# Patient Record
Sex: Male | Born: 1995 | Race: Black or African American | Hispanic: No | Marital: Single | Smoking: Current every day smoker
Health system: Southern US, Community
[De-identification: ages and names within clinical notes are randomized; demographics above are authoritative.]

## PROBLEM LIST (undated history)

## (undated) DIAGNOSIS — S62309A Unspecified fracture of unspecified metacarpal bone, initial encounter for closed fracture: Secondary | ICD-10-CM

## (undated) DIAGNOSIS — J45909 Unspecified asthma, uncomplicated: Secondary | ICD-10-CM

## (undated) HISTORY — PX: TONSILLECTOMY AND ADENOIDECTOMY: SHX28

## (undated) HISTORY — PX: TONSILLECTOMY: SUR1361

---
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
## 2016-10-04 ENCOUNTER — Emergency Department (HOSPITAL_COMMUNITY): Payer: Self-pay

## 2016-10-04 ENCOUNTER — Emergency Department (HOSPITAL_COMMUNITY)
Admission: EM | Admit: 2016-10-04 | Discharge: 2016-10-04 | Disposition: A | Discharge status: Home / Self Care | Payer: Self-pay | Attending: Emergency Medicine | Admitting: Emergency Medicine

## 2016-10-04 DIAGNOSIS — S62231A Other displaced fracture of base of first metacarpal bone, right hand, initial encounter for closed fracture: Secondary | ICD-10-CM | POA: Insufficient documentation

## 2016-10-04 DIAGNOSIS — Y929 Unspecified place or not applicable: Secondary | ICD-10-CM | POA: Insufficient documentation

## 2016-10-04 DIAGNOSIS — Y999 Unspecified external cause status: Secondary | ICD-10-CM | POA: Insufficient documentation

## 2016-10-04 DIAGNOSIS — W010XXA Fall on same level from slipping, tripping and stumbling without subsequent striking against object, initial encounter: Secondary | ICD-10-CM | POA: Insufficient documentation

## 2016-10-04 DIAGNOSIS — Y939 Activity, unspecified: Secondary | ICD-10-CM | POA: Insufficient documentation

## 2016-10-04 DIAGNOSIS — J45909 Unspecified asthma, uncomplicated: Secondary | ICD-10-CM | POA: Insufficient documentation

## 2016-10-04 MED ORDER — TRAMADOL HCL 50 MG PO TABS: 50.0000 mg | ORAL_TABLET | Freq: Four times a day (QID) | ORAL | 0 refills | Status: DC | PRN

## 2016-10-04 MED ORDER — HYDROCODONE-ACETAMINOPHEN 5-325 MG PO TABS
1.0000 | ORAL_TABLET | Freq: Once | ORAL | Status: AC
  Administered 2016-10-04: 1 via ORAL
  Filled 2016-10-04: qty 1

## 2016-10-04 NOTE — Discharge Instructions (Signed)
You were seen here today for hand pain. You have broken the first joint of your thumb. Please remain in your splint until you are seen by the hand specialist. Please call and make an appointment for this week. I have called and talked to him about your case. For pain control you may take:  800mg  of ibuprofen (that is usually 4 over the counter pills)  3 times a day (take with food) and acetaminophen 975mg  (this is 3 over the counter pills) four times a day. Do not drink alcohol or combine with other medications that have acetaminophen as an ingredient (Read the labels!).  For breakthrough pain you may take tramadol. Do not drink alcohol drive or operate heavy machinery when taking tramadol. Follow RICE therapy. If you develop worsening or new concerning symptoms you can return to the emergency department for re-evaluation.

## 2016-10-04 NOTE — Progress Notes (Signed)
Orthopedic Tech Progress Note Patient Details:  Don PonderDonel J Perry 1995/12/14 [id number]  Ortho Devices Type of Ortho Device: Ace wrap, Thumb spica splint Splint Material: Fiberglass Ortho Device/Splint Location: RUE Ortho Device/Splint Interventions: Ordered, Application   Jennye MoccasinHughes, [name] 10/04/2016, 9:18 PM

## 2016-10-04 NOTE — ED Provider Notes (Signed)
[facility]-EMERGENCY DEPT Provider Note   CSN: [id number] Arrival date & time: 10/04/16  1851     History   Chief Complaint Chief Complaint  Patient presents with  . Hand Injury    HPI Don Perry is a 21 y.o. right-handed male who presents emergency department today for right hand injury that occurred at approximately 5 PM yesterday. Patient was at work moving a couch onto a loading dock of a truck when he slipped and fell. The patient caught himself with an outstretched right hand. He now is having swelling and pain to the base of the right thumb. He is not able to move the right thumb without any pain. He has not taken anything for this. He denies numbness or tingling distal to the injury. No open wound.  HPI  Past Medical History:  Diagnosis Date  . Asthma     Patient Active Problem List   Diagnosis Date Noted  . Sports physical 07/15/2011    No past surgical history on file.     Home Medications    Prior to Admission medications   Medication Sig Start Date End Date Taking? Authorizing Provider  acetaminophen (TYLENOL) 500 MG tablet Take 500 mg by mouth every 6 (six) hours as needed. For pain    [provider]  albuterol (PROVENTIL HFA;VENTOLIN HFA) 108 (90 BASE) MCG/ACT inhaler Inhale 2 puffs into the lungs every 6 (six) hours as needed.    [provider]  traMADol (ULTRAM) 50 MG tablet Take 1 tablet (50 mg total) by mouth every 6 (six) hours as needed. 10/04/16   Jodene Polyak, Elmer SowMichael M, PA-C    Family History Family History  Problem Relation Age of Onset  . Sudden death Neg Hx   . Heart attack Neg Hx     Social History Social History  Substance Use Topics  . Smoking status: Never Smoker  . Smokeless tobacco: Not on file  . Alcohol use No     Allergies   Penicillins   Review of Systems Review of Systems  Constitutional: Negative for chills and fever.  Musculoskeletal: Positive for arthralgias and joint swelling.  Skin: Negative for  wound.  Neurological: Negative for numbness.     Physical Exam Updated Vital Signs BP 140/74 (BP Location: Right Arm)   Pulse 70   Temp 98.1 F (36.7 C) (Oral)   Resp 16   SpO2 98%   Physical Exam  Constitutional: He appears well-developed and well-nourished.  HENT:  Head: Normocephalic and atraumatic.  Right Ear: External ear normal.  Left Ear: External ear normal.  Eyes: Conjunctivae are normal. Right eye exhibits no discharge. Left eye exhibits no discharge. No scleral icterus.  Cardiovascular:  Pulses:      Radial pulses are 2+ on the right side, and 2+ on the left side.  Pulmonary/Chest: Effort normal. No respiratory distress.  Musculoskeletal:       Right wrist: Normal.  Right hand: Skin intact. Swelling to the base of the right 1st MCP. TTP at the base of the 1st MCP.  Limited ROM of the 1st MCP. 1st DIP isolated flexion and extension intact. Radial artery 2+. Finger with <2sec cap refill. SILT in M/U/R distributions.   Neurological: He is alert. No sensory deficit.  Skin: No pallor.  Psychiatric: He has a normal mood and affect.  Nursing note and vitals reviewed.    ED Treatments / Results  Labs (all labs ordered are listed, but only abnormal results are displayed) Labs Reviewed -  No data to display  EKG  EKG Interpretation None       Radiology Dg Hand 2 View Right  Result Date: 10/04/2016 CLINICAL DATA:  Acute right hand pain following fall yesterday. Initial encounter. EXAM: RIGHT HAND - 2 VIEW COMPARISON:  None. FINDINGS: An intraarticular fracture at the base of the first metacarpal is noted with 2 mm displacement. No other acute fracture identified. There is no evidence of dislocation. IMPRESSION: Intraarticular fracture at the base of the first metacarpal. Electronically Signed   By: Harmon Pier M.D.   On: 10/04/2016 19:54    Procedures Procedures (including critical care time)  Medications Ordered in ED Medications  HYDROcodone-acetaminophen  (NORCO/VICODIN) 5-325 MG per tablet 1 tablet (1 tablet Oral Given 10/04/16 2027)     Initial Impression / Assessment and Plan / ED Course  I have reviewed the triage vital signs and the nursing notes.  Pertinent labs & imaging results that were available during my care of the patient were reviewed by me and considered in my medical decision making (see chart for details).     21 year old male with injury to the dominant right hand. This occurred after FOOSH like injury yesterday. Patient has pain and swelling to the base of right thumb. He is NVI. On xray there is noted Intraarticular fracture at the base of the first metacarpal. Will place consult to hand. Advised Thumb spica and follow up this week. Patient placed in thumb spica and given instructions on this. Pain managed in ED. Patient reviewed in [place] Controlled Substance Reporting System. Will provide the patient with short course of pain medication as an outpatient. Advised RICE therapy. Specific return precautions discussed. The patient verbalized understanding and agreement with plan. All questions answered. No further questions at this time. The patient  appears safe for discharge.  Final Clinical Impressions(s) / ED Diagnoses   Final diagnoses:  Closed displaced fracture of base of first metacarpal bone of right hand, unspecified fracture morphology, initial encounter    New Prescriptions New Prescriptions   TRAMADOL (ULTRAM) 50 MG TABLET    Take 1 tablet (50 mg total) by mouth every 6 (six) hours as needed.     Jacinto Halim, PA-C 10/04/16 2105    Melene Plan, DO 10/04/16 2307

## 2016-10-04 NOTE — ED Notes (Signed)
Patient transported to X-ray 

## 2016-10-04 NOTE — ED Triage Notes (Signed)
Pt states yesterday he fell walking down truck ramp and caught himself with right hand, c/o right hand pain and swelling. Radial pulses +3 sensation intact.

## 2016-10-13 ENCOUNTER — Ambulatory Visit: Payer: Self-pay | Admitting: Orthopedic Surgery

## 2016-10-13 ENCOUNTER — Encounter (HOSPITAL_COMMUNITY): Payer: Self-pay | Admitting: *Deleted

## 2016-10-13 NOTE — Progress Notes (Signed)
Pt denies SOB, chest pain, and being under the care of a cardiologist. Pt denies having a stress test, echo and cardiac cath. Pt denies having an EKG and chest x ray. Pt denies having recent labs. Pt made aware to stop taking Aspirin,vitamins, fish oil and herbal medications. Do not take any NSAIDs ie: Ibuprofen, Advil, Naproxen (Aleve), Motrin, BC and Goody Powder or any medication containing Aspirin. Pt verbalized understanding of all pre-op instructions.

## 2016-10-14 ENCOUNTER — Encounter (HOSPITAL_COMMUNITY): Payer: Self-pay | Admitting: Certified Registered Nurse Anesthetist

## 2016-10-14 ENCOUNTER — Ambulatory Visit (HOSPITAL_COMMUNITY)
Admission: RE | Admit: 2016-10-14 | Discharge: 2016-10-14 | Disposition: A | Discharge status: Home / Self Care | Payer: Self-pay | Source: Ambulatory Visit | Attending: Orthopedic Surgery | Admitting: Orthopedic Surgery

## 2016-10-14 ENCOUNTER — Ambulatory Visit (HOSPITAL_COMMUNITY): Payer: Self-pay | Admitting: Certified Registered Nurse Anesthetist

## 2016-10-14 ENCOUNTER — Encounter (HOSPITAL_COMMUNITY)
Admission: RE | Disposition: A | Discharge status: Home / Self Care | Payer: Self-pay | Source: Ambulatory Visit | Attending: Orthopedic Surgery

## 2016-10-14 DIAGNOSIS — S62231A Other displaced fracture of base of first metacarpal bone, right hand, initial encounter for closed fracture: Secondary | ICD-10-CM | POA: Insufficient documentation

## 2016-10-14 DIAGNOSIS — Z880 Allergy status to penicillin: Secondary | ICD-10-CM | POA: Insufficient documentation

## 2016-10-14 DIAGNOSIS — X58XXXA Exposure to other specified factors, initial encounter: Secondary | ICD-10-CM | POA: Insufficient documentation

## 2016-10-14 DIAGNOSIS — J45909 Unspecified asthma, uncomplicated: Secondary | ICD-10-CM | POA: Insufficient documentation

## 2016-10-14 DIAGNOSIS — Y939 Activity, unspecified: Secondary | ICD-10-CM | POA: Insufficient documentation

## 2016-10-14 DIAGNOSIS — F17210 Nicotine dependence, cigarettes, uncomplicated: Secondary | ICD-10-CM | POA: Insufficient documentation

## 2016-10-14 DIAGNOSIS — Y929 Unspecified place or not applicable: Secondary | ICD-10-CM | POA: Insufficient documentation

## 2016-10-14 HISTORY — DX: Unspecified fracture of unspecified metacarpal bone, initial encounter for closed fracture: S62.309A

## 2016-10-14 HISTORY — PX: OPEN REDUCTION INTERNAL FIXATION (ORIF) METACARPAL: SHX6234

## 2016-10-14 LAB — CBC
HEMATOCRIT: 45 % (ref 39.0–52.0)
HEMOGLOBIN: 14.8 g/dL (ref 13.0–17.0)
MCH: 29.8 pg (ref 26.0–34.0)
MCHC: 32.9 g/dL (ref 30.0–36.0)
MCV: 90.5 fL (ref 78.0–100.0)
Platelets: 218 10*3/uL (ref 150–400)
RBC: 4.97 MIL/uL (ref 4.22–5.81)
RDW: 12.9 % (ref 11.5–15.5)
WBC: 3 10*3/uL — ABNORMAL LOW (ref 4.0–10.5)

## 2016-10-14 SURGERY — OPEN REDUCTION INTERNAL FIXATION (ORIF) METACARPAL
Anesthesia: General | Site: Hand | Laterality: Right

## 2016-10-14 MED ORDER — PROMETHAZINE HCL 25 MG/ML IJ SOLN: 6.2500 mg | INTRAMUSCULAR | Status: DC | PRN

## 2016-10-14 MED ORDER — BUPIVACAINE HCL (PF) 0.25 % IJ SOLN
INTRAMUSCULAR | Status: DC | PRN
  Administered 2016-10-14: 10 mL

## 2016-10-14 MED ORDER — MIDAZOLAM HCL 2 MG/2ML IJ SOLN
INTRAMUSCULAR | Status: AC
  Filled 2016-10-14: qty 2

## 2016-10-14 MED ORDER — CHLORHEXIDINE GLUCONATE 4 % EX LIQD: 60.0000 mL | Freq: Once | CUTANEOUS | Status: DC

## 2016-10-14 MED ORDER — PROPOFOL 10 MG/ML IV BOLUS
INTRAVENOUS | Status: AC
  Filled 2016-10-14: qty 40

## 2016-10-14 MED ORDER — POVIDONE-IODINE 10 % EX SWAB: 2.0000 "application " | Freq: Once | CUTANEOUS | Status: DC

## 2016-10-14 MED ORDER — LACTATED RINGERS IV SOLN: INTRAVENOUS | Status: DC

## 2016-10-14 MED ORDER — LACTATED RINGERS IV SOLN
INTRAVENOUS | Status: DC
  Administered 2016-10-14: 50 mL/h via INTRAVENOUS

## 2016-10-14 MED ORDER — VANCOMYCIN HCL IN DEXTROSE 1-5 GM/200ML-% IV SOLN
INTRAVENOUS | Status: AC
  Filled 2016-10-14: qty 200

## 2016-10-14 MED ORDER — OXYCODONE HCL 5 MG PO TABS
ORAL_TABLET | ORAL | Status: AC
  Administered 2016-10-14: 5 mg
  Filled 2016-10-14: qty 1

## 2016-10-14 MED ORDER — HYDROMORPHONE HCL 1 MG/ML IJ SOLN
INTRAMUSCULAR | Status: AC
  Administered 2016-10-14: 0.5 mg via INTRAVENOUS
  Filled 2016-10-14: qty 2

## 2016-10-14 MED ORDER — HYDROMORPHONE HCL 1 MG/ML IJ SOLN
0.2500 mg | INTRAMUSCULAR | Status: DC | PRN
  Administered 2016-10-14 (×4): 0.5 mg via INTRAVENOUS

## 2016-10-14 MED ORDER — OXYCODONE HCL 5 MG PO TABS: 5.0000 mg | ORAL_TABLET | Freq: Once | ORAL | Status: DC

## 2016-10-14 MED ORDER — PROPOFOL 10 MG/ML IV BOLUS
INTRAVENOUS | Status: DC | PRN
  Administered 2016-10-14: 200 mg via INTRAVENOUS

## 2016-10-14 MED ORDER — LACTATED RINGERS IV SOLN: INTRAVENOUS | Status: DC | PRN

## 2016-10-14 MED ORDER — LIDOCAINE 2% (20 MG/ML) 5 ML SYRINGE
INTRAMUSCULAR | Status: DC | PRN
  Administered 2016-10-14: 50 mg via INTRAVENOUS

## 2016-10-14 MED ORDER — VANCOMYCIN HCL IN DEXTROSE 1-5 GM/200ML-% IV SOLN
1000.0000 mg | INTRAVENOUS | Status: AC
  Administered 2016-10-14: 1000 mg via INTRAVENOUS

## 2016-10-14 MED ORDER — FENTANYL CITRATE (PF) 100 MCG/2ML IJ SOLN
INTRAMUSCULAR | Status: DC | PRN
  Administered 2016-10-14: 25 ug via INTRAVENOUS
  Administered 2016-10-14 (×2): 50 ug via INTRAVENOUS

## 2016-10-14 MED ORDER — MEPERIDINE HCL 25 MG/ML IJ SOLN: 6.2500 mg | INTRAMUSCULAR | Status: DC | PRN

## 2016-10-14 MED ORDER — FENTANYL CITRATE (PF) 250 MCG/5ML IJ SOLN
INTRAMUSCULAR | Status: AC
  Filled 2016-10-14: qty 5

## 2016-10-14 MED ORDER — BUPIVACAINE HCL (PF) 0.5 % IJ SOLN
INTRAMUSCULAR | Status: AC
  Filled 2016-10-14: qty 20

## 2016-10-14 MED ORDER — MIDAZOLAM HCL 5 MG/5ML IJ SOLN
INTRAMUSCULAR | Status: DC | PRN
  Administered 2016-10-14: 2 mg via INTRAVENOUS

## 2016-10-14 SURGICAL SUPPLY — 53 items
BANDAGE ACE 3X5.8 VEL STRL LF (GAUZE/BANDAGES/DRESSINGS) ×3 IMPLANT
BANDAGE ACE 4X5 VEL STRL LF (GAUZE/BANDAGES/DRESSINGS) ×3 IMPLANT
BANDAGE ELASTIC 4 VELCRO ST LF (GAUZE/BANDAGES/DRESSINGS) ×3 IMPLANT
BIT DRILL 1.8 CANN MAX VPC (BIT) ×3 IMPLANT
BLADE CLIPPER SURG (BLADE) IMPLANT
BNDG ESMARK 4X9 LF (GAUZE/BANDAGES/DRESSINGS) ×3 IMPLANT
BNDG GAUZE ELAST 4 BULKY (GAUZE/BANDAGES/DRESSINGS) ×3 IMPLANT
CORDS BIPOLAR (ELECTRODE) ×3 IMPLANT
COVER SURGICAL LIGHT HANDLE (MISCELLANEOUS) ×3 IMPLANT
CUFF TOURNIQUET SINGLE 18IN (TOURNIQUET CUFF) ×3 IMPLANT
CUFF TOURNIQUET SINGLE 24IN (TOURNIQUET CUFF) IMPLANT
DRAIN TLS ROUND 10FR (DRAIN) IMPLANT
DRAPE OEC MINIVIEW 54X84 (DRAPES) ×3 IMPLANT
DRAPE SURG 17X23 STRL (DRAPES) ×3 IMPLANT
DRSG EMULSION OIL 3X3 NADH (GAUZE/BANDAGES/DRESSINGS) ×3 IMPLANT
GAUZE SPONGE 4X4 12PLY STRL (GAUZE/BANDAGES/DRESSINGS) ×3 IMPLANT
GAUZE SPONGE 4X4 12PLY STRL LF (GAUZE/BANDAGES/DRESSINGS) ×3 IMPLANT
GAUZE XEROFORM 1X8 LF (GAUZE/BANDAGES/DRESSINGS) ×3 IMPLANT
GLOVE BIOGEL M 8.0 STRL (GLOVE) ×3 IMPLANT
GLOVE SS BIOGEL STRL SZ 8 (GLOVE) ×1 IMPLANT
GLOVE SUPERSENSE BIOGEL SZ 8 (GLOVE) ×2
GOWN STRL REUS W/ TWL LRG LVL3 (GOWN DISPOSABLE) ×3 IMPLANT
GOWN STRL REUS W/ TWL XL LVL3 (GOWN DISPOSABLE) ×3 IMPLANT
GOWN STRL REUS W/TWL LRG LVL3 (GOWN DISPOSABLE) ×6
GOWN STRL REUS W/TWL XL LVL3 (GOWN DISPOSABLE) ×6
K-WIRE COCR 0.9X95 (WIRE) ×6
KIT BASIN OR (CUSTOM PROCEDURE TRAY) ×3 IMPLANT
KIT ROOM TURNOVER OR (KITS) ×3 IMPLANT
KWIRE COCR 0.9X95 (WIRE) ×2 IMPLANT
MANIFOLD NEPTUNE II (INSTRUMENTS) IMPLANT
NEEDLE 22X1 1/2 (OR ONLY) (NEEDLE) ×3 IMPLANT
NS IRRIG 1000ML POUR BTL (IV SOLUTION) ×3 IMPLANT
PACK ORTHO EXTREMITY (CUSTOM PROCEDURE TRAY) ×3 IMPLANT
PAD ARMBOARD 7.5X6 YLW CONV (MISCELLANEOUS) ×6 IMPLANT
PAD CAST 3X4 CTTN HI CHSV (CAST SUPPLIES) ×1 IMPLANT
PAD CAST 4YDX4 CTTN HI CHSV (CAST SUPPLIES) ×1 IMPLANT
PADDING CAST COTTON 3X4 STRL (CAST SUPPLIES) ×2
PADDING CAST COTTON 4X4 STRL (CAST SUPPLIES) ×2
SCREW VPC 2.5X14MM (Screw) ×3 IMPLANT
SCRUB BETADINE 4OZ XXX (MISCELLANEOUS) IMPLANT
SOL PREP POV-IOD 4OZ 10% (MISCELLANEOUS) IMPLANT
SPONGE LAP 4X18 X RAY DECT (DISPOSABLE) ×3 IMPLANT
SUT MNCRL AB 4-0 PS2 18 (SUTURE) ×3 IMPLANT
SUT PROLENE 3 0 PS 2 (SUTURE) IMPLANT
SUT VIC AB 3-0 FS2 27 (SUTURE) IMPLANT
SYR CONTROL 10ML LL (SYRINGE) ×3 IMPLANT
SYSTEM CHEST DRAIN TLS 7FR (DRAIN) IMPLANT
TOWEL OR 17X24 6PK STRL BLUE (TOWEL DISPOSABLE) ×3 IMPLANT
TOWEL OR 17X26 10 PK STRL BLUE (TOWEL DISPOSABLE) ×3 IMPLANT
TUBE CONNECTING 12'X1/4 (SUCTIONS) ×1
TUBE CONNECTING 12X1/4 (SUCTIONS) ×2 IMPLANT
TUBE EVACUATION TLS (MISCELLANEOUS) ×3 IMPLANT
WATER STERILE IRR 1000ML POUR (IV SOLUTION) ×3 IMPLANT

## 2016-10-14 NOTE — Anesthesia Preprocedure Evaluation (Addendum)
Anesthesia Evaluation  Patient identified by MRN, date of birth, ID band Patient awake    Reviewed: Allergy & Precautions, NPO status , Patient's Chart, lab work & pertinent test results  Airway Mallampati: I  TM Distance: >3 FB Neck ROM: Full    Dental  (+) Teeth Intact, Dental Advisory Given   Pulmonary asthma , Current Smoker,    breath sounds clear to auscultation       Cardiovascular negative cardio ROS   Rhythm:Regular Rate:Normal     Neuro/Psych negative neurological ROS     GI/Hepatic negative GI ROS, Neg liver ROS,   Endo/Other  negative endocrine ROS  Renal/GU negative Renal ROS     Musculoskeletal negative musculoskeletal ROS (+)   Abdominal   Peds  Hematology negative hematology ROS (+)   Anesthesia Other Findings Day of surgery medications reviewed with the patient.  Reproductive/Obstetrics                            Anesthesia Physical Anesthesia Plan  ASA: II  Anesthesia Plan: General   Post-op Pain Management:    Induction: Intravenous  PONV Risk Score and Plan: 2 and Ondansetron and Dexamethasone  Airway Management Planned: LMA  Additional Equipment:   Intra-op Plan:   Post-operative Plan: Extubation in OR  Informed Consent: I have reviewed the patients History and Physical, chart, labs and discussed the procedure including the risks, benefits and alternatives for the proposed anesthesia with the patient or authorized representative who has indicated his/her understanding and acceptance.   Dental advisory given  Plan Discussed with: CRNA  Anesthesia Plan Comments: (Pt does not want entire arm to be numb. )       Anesthesia Quick Evaluation

## 2016-10-14 NOTE — H&P (Signed)
Don Perry is an 21 y.o. male.   Chief Complaint: right 1st MC Fx displaced HPI: Patient presents for evaluation and treatment of the of their upper extremity predicament. The patient denies neck, back, chest or  abdominal pain. The patient notes that they have no lower extremity problems. The patients primary complaint is noted. We are planning surgical care pathway for the upper extremity.  Past Medical History:  Diagnosis Date  . Asthma   . Metacarpal bone fracture    right first    Past Surgical History:  Procedure Laterality Date  . TONSILLECTOMY AND ADENOIDECTOMY      Family History  Problem Relation Age of Onset  . Sudden death Neg Hx   . Heart attack Neg Hx    Social History:  reports that he has been smoking Cigarettes.  He has been smoking about 0.50 packs per day. He has never used smokeless tobacco. He reports that he uses drugs, including Marijuana. He reports that he does not drink alcohol.  Allergies:  Allergies  Allergen Reactions  . Penicillins Hives    Has patient had a PCN reaction causing immediate rash, facial/tongue/throat swelling, SOB or lightheadedness with hypotension: Yes Has patient had a PCN reaction causing severe rash involving mucus membranes or skin necrosis: No Has patient had a PCN reaction that required hospitalization: No Has patient had a PCN reaction occurring within the last 10 years: No If all of the above answers are "NO", then may proceed with Cephalosporin use.     Medications Prior to Admission  Medication Sig Dispense Refill  . traMADol (ULTRAM) 50 MG tablet Take 1 tablet (50 mg total) by mouth every 6 (six) hours as needed. (Patient not taking: Reported on 10/13/2016) 12 tablet 0    Results for orders placed or performed during the hospital encounter of 10/14/16 (from the past 48 hour(s))  CBC     Status: Abnormal   Collection Time: 10/14/16 12:17 PM  Result Value Ref Range   WBC 3.0 (L) 4.0 - 10.5 K/uL   RBC 4.97 4.22 -  5.81 MIL/uL   Hemoglobin 14.8 13.0 - 17.0 g/dL   HCT 16.1 09.6 - 04.5 %   MCV 90.5 78.0 - 100.0 fL   MCH 29.8 26.0 - 34.0 pg   MCHC 32.9 30.0 - 36.0 g/dL   RDW 40.9 81.1 - 91.4 %   Platelets 218 150 - 400 K/uL   No results found.  Review of Systems  Respiratory: Negative.   Cardiovascular: Negative.   Gastrointestinal: Negative.   Genitourinary: Negative.     Blood pressure 124/73, pulse 61, temperature 98.3 F (36.8 C), resp. rate 16, height  (1.803 Perry), weight 79.2 kg (174 lb 9.7 oz), SpO2 99 %. Physical Exam Fx right 1st [facility] Fx with pain and deformity The patient is alert and oriented in no acute distress. The patient complains of pain in the affected upper extremity.  The patient is noted to have a normal HEENT exam. Lung fields show equal chest expansion and no shortness of breath. Abdomen exam is nontender without distention. Lower extremity examination does not show any fracture dislocation or blood clot symptoms. Pelvis is stable and the neck and back are stable and nontender.  Assessment/Plan Plan ORIF 1st MC fracture We are planning surgery for your upper extremity. The risk and benefits of surgery to include risk of bleeding, infection, anesthesia,  damage to normal structures and failure of the surgery to accomplish its intended goals of relieving  symptoms and restoring function have been discussed in detail. With this in mind we plan to proceed. I have specifically discussed with the patient the pre-and postoperative regime and the dos and don'ts and risk and benefits in great detail. Risk and benefits of surgery also include risk of dystrophy(CRPS), chronic nerve pain, failure of the healing process to go onto completion and other inherent risks of surgery The relavent the pathophysiology of the disease/injury process, as well as the alternatives for treatment and postoperative course of action has been discussed in great detail with the patient who desires to  proceed.  We will do everything in our power to help you (the patient) restore function to the upper extremity. It is a pleasure to see this patient today.   Karen ChafeGRAMIG III,Don Norwood M, MD 10/14/2016, 4:34 PM

## 2016-10-14 NOTE — Op Note (Signed)
See [id number]#[id number]  SP ORIF right thumb fracture  Bayler Gehrig MD

## 2016-10-14 NOTE — Transfer of Care (Signed)
Immediate Anesthesia Transfer of Care Note  Patient: Don Perry  Procedure(s) Performed: Procedure(s): OPEN REDUCTION INTERNAL FIXATION (ORIF)  RIGHT FIRST METACARPAL (Right)  Patient Location: PACU  Anesthesia Type:General  Level of Consciousness: awake and alert   Airway & Oxygen Therapy: Patient Spontanous Breathing  Post-op Assessment: Report given to RN and Post -op Vital signs reviewed and stable  Post vital signs: Reviewed and stable  Last Vitals:  Vitals:   10/14/16 1223  BP: 124/73  Pulse: 61  Resp: 16  Temp: 36.8 C  SpO2: 99%    Last Pain:  Vitals:   10/14/16 1234  PainSc: 8       Patients Stated Pain Goal: 8 (10/14/16 1234)  Complications: No apparent anesthesia complications

## 2016-10-14 NOTE — Discharge Instructions (Signed)

## 2016-10-14 NOTE — Anesthesia Procedure Notes (Signed)
Procedure Name: LMA Insertion Date/Time: 10/07/2016 4:56 PM Performed by: Gwenyth AllegraADAMI, Eligha Kmetz Pre-anesthesia Checklist: Patient identified, Emergency Drugs available, Suction available, Patient being monitored and Timeout performed Patient Re-evaluated:Patient Re-evaluated prior to induction Oxygen Delivery Method: Circle system utilized Preoxygenation: Pre-oxygenation with 100% oxygen Induction Type: IV induction LMA: LMA inserted LMA Size: 4.0 Number of attempts: 1 Placement Confirmation: positive ETCO2 and breath sounds checked- equal and bilateral Tube secured with: Tape Dental Injury: Teeth and Oropharynx as per pre-operative assessment

## 2016-10-15 ENCOUNTER — Encounter (HOSPITAL_COMMUNITY): Payer: Self-pay | Admitting: Orthopedic Surgery

## 2016-10-15 NOTE — Anesthesia Postprocedure Evaluation (Signed)
Anesthesia Post Note  Patient: Don Perry  Procedure(s) Performed: Procedure(s) (LRB): OPEN REDUCTION INTERNAL FIXATION (ORIF)  RIGHT FIRST METACARPAL (Right)     Patient location during evaluation: PACU Anesthesia Type: General Level of consciousness: awake and alert Pain management: pain level controlled Vital Signs Assessment: post-procedure vital signs reviewed and stable Respiratory status: spontaneous breathing, nonlabored ventilation, respiratory function stable and patient connected to nasal cannula oxygen Cardiovascular status: blood pressure returned to baseline and stable Postop Assessment: no signs of nausea or vomiting Anesthetic complications: no    Last Vitals:  Vitals:   10/14/16 1859 10/14/16 1915  BP: (!) 137/100 (!) 136/100  Pulse: 68 65  Resp: 18 17  Temp:    SpO2: 97% 97%    Last Pain:  Vitals:   10/14/16 1915  PainSc: 3                  Nirvi Boehler,W. EDMOND

## 2016-10-15 NOTE — Op Note (Signed)
NAME:  Don Perry, Don Perry                      ACCOUNT NO.:  MEDICAL RECORD NO.:  [id number]  LOCATION:                                 FACILITY:  PHYSICIAN:  Dionne AnoWilliam M. Yaakov Saindon, M.D.DATE OF BIRTH:  [birth year]  DATE OF PROCEDURE: DATE OF DISCHARGE:                              OPERATIVE REPORT   PREOPERATIVE DIAGNOSIS:  Comminuted intra-articular first metacarpal base fracture, right upper extremity.  POSTOPERATIVE DIAGNOSIS:  Comminuted intra-articular first metacarpal base fracture, right upper extremity.  PROCEDURES: 1. Open reduction and internal fixation, first CMC/carpometacarpal     fracture with Biomet headless screw. 2. 4-view x-ray series. 3. Superficial radial nerve neurolysis, extensive in nature.  SURGEON:  Dionne AnoWilliam M. Amanda PeaGramig, M.D.  ASSISTANT:  None.  COMPLICATIONS:  None.  ANESTHESIA:  General.  TOURNIQUET TIME:  Less than an hour.  INDICATIONS:  The patient is a 21 year old male who presents with an intra-articular fracture about the thumb.  The patient and I have discussed all the issues.  He desires to proceed with surgical intervention.  DESCRIPTION OF PROCEDURE:  He was taken to the operative theater and underwent a general anesthetic.  He was prepped with 2 separate Hibiclens scrubs followed by Betadine scrub and paint followed by sterile field being secured and time-out called.  The patient had manipulative reduction performed, however, the fracture did not move whatsoever and was impacted.  This was a Bennett-type fracture intra- articular at the [facility] joint at the base of the thumb, right upper extremity.  At this time, I made a Wagner incision and very carefully dissected down.  Superficial radial nerve underwent a neurolysis, and branching patterns were protected under 4.0 loupe magnification with facial nerve dissector.  Following this, the thenar musculature was elevated and the fracture was then accessed volarly.  I very carefully teased away the  fracture from its impacted position and aligned this about the joint.  I then placed a towel clamp-type reduction device and verified correct reduction.  The reduction looked excellent.  Once this was complete, we then placed a headless Biomet variable compression screw from dorsal to palmar very carefully.  This was done without difficulty.  This was a 14-mm screw, and there were no complicating features.  This achieved compression of cancellous surfaces with the variable angle screw and I was quite pleased.  I placed him through a range of motion, all looked well, there was no tendency towards drift or displacement.  Following this, I then repaired the thenar musculature and closed the fascial interval with FiberWire and then closed the skin edge with Prolene followed by final copy of x-rays had been taken.  The superficial radial nerve was protected at all times, and there were no complicating features.  All sponge, needle, and instrument counts were correct.  He was placed in a thumb spica splint, and 10 mL of Sensorcaine was used for postop analgesia.     Dionne AnoWilliam M. Amanda PeaGramig, M.D.     [facility]  D:  10/14/2016  T:  10/14/2016  Job:  [id number]

## 2018-11-27 IMAGING — CR DG HAND 2V*R*
2 series · 2 of 2 positions shown · non-contrast
Comparison: None.

CLINICAL DATA: Acute right hand pain following fall yesterday.
Initial encounter.

EXAM:
RIGHT HAND - 2 VIEW

[hand pa]
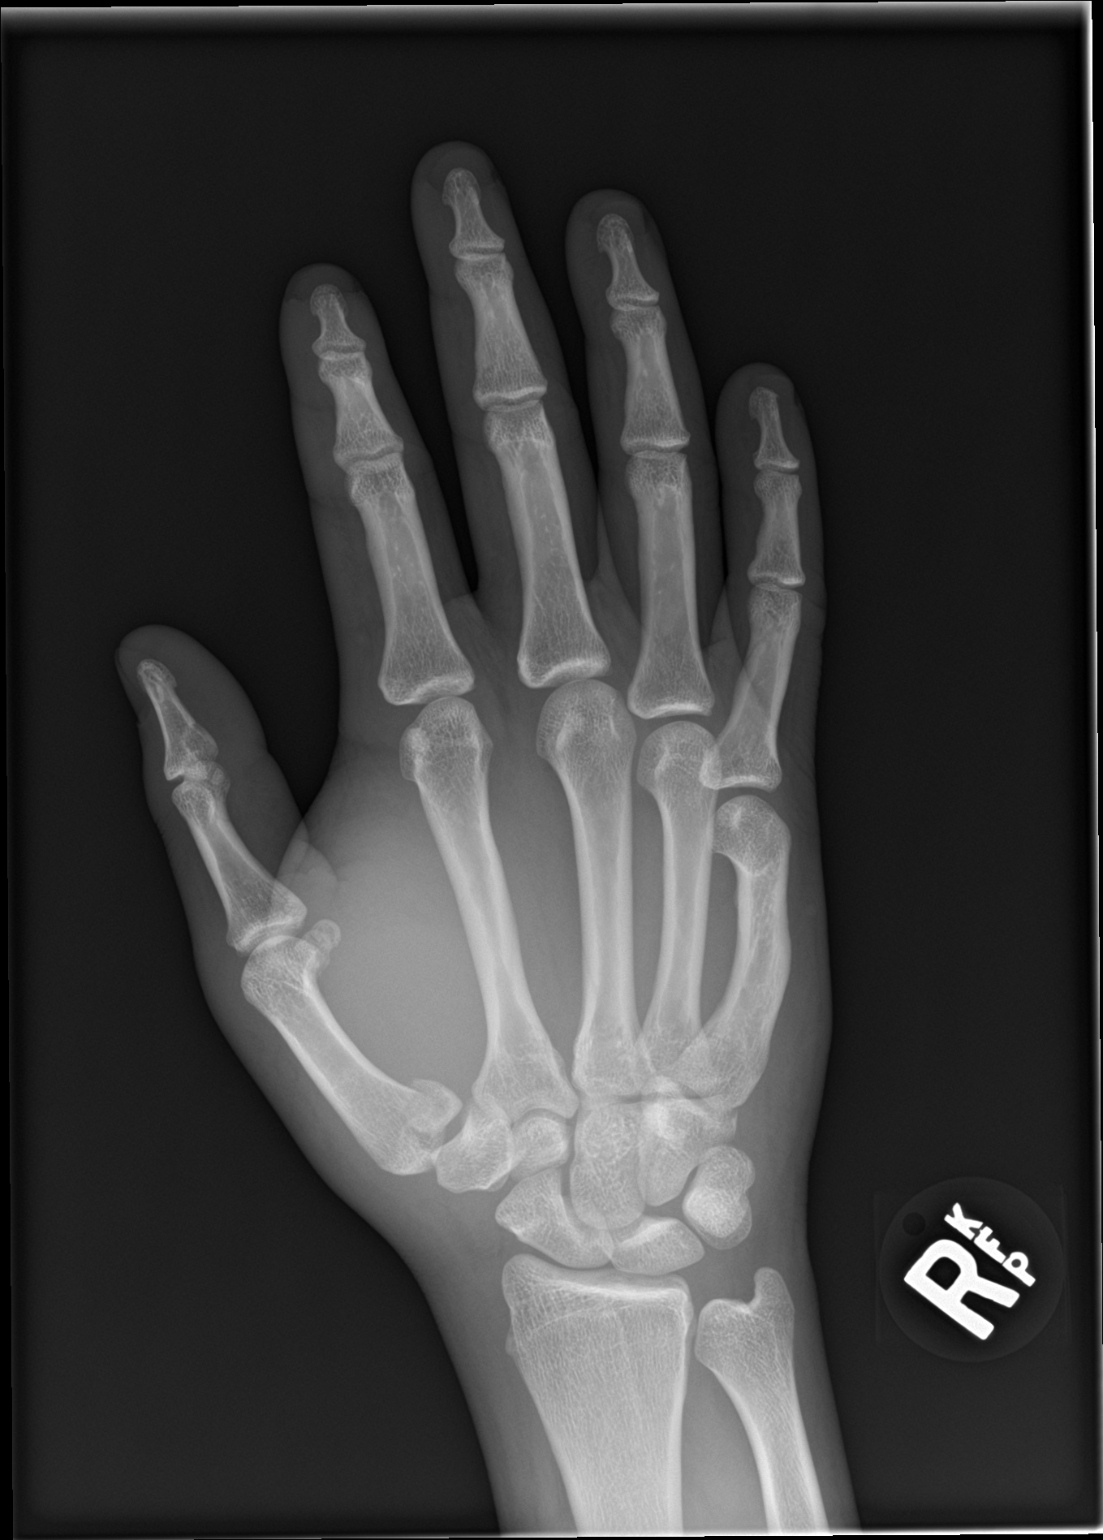

[hand lat]
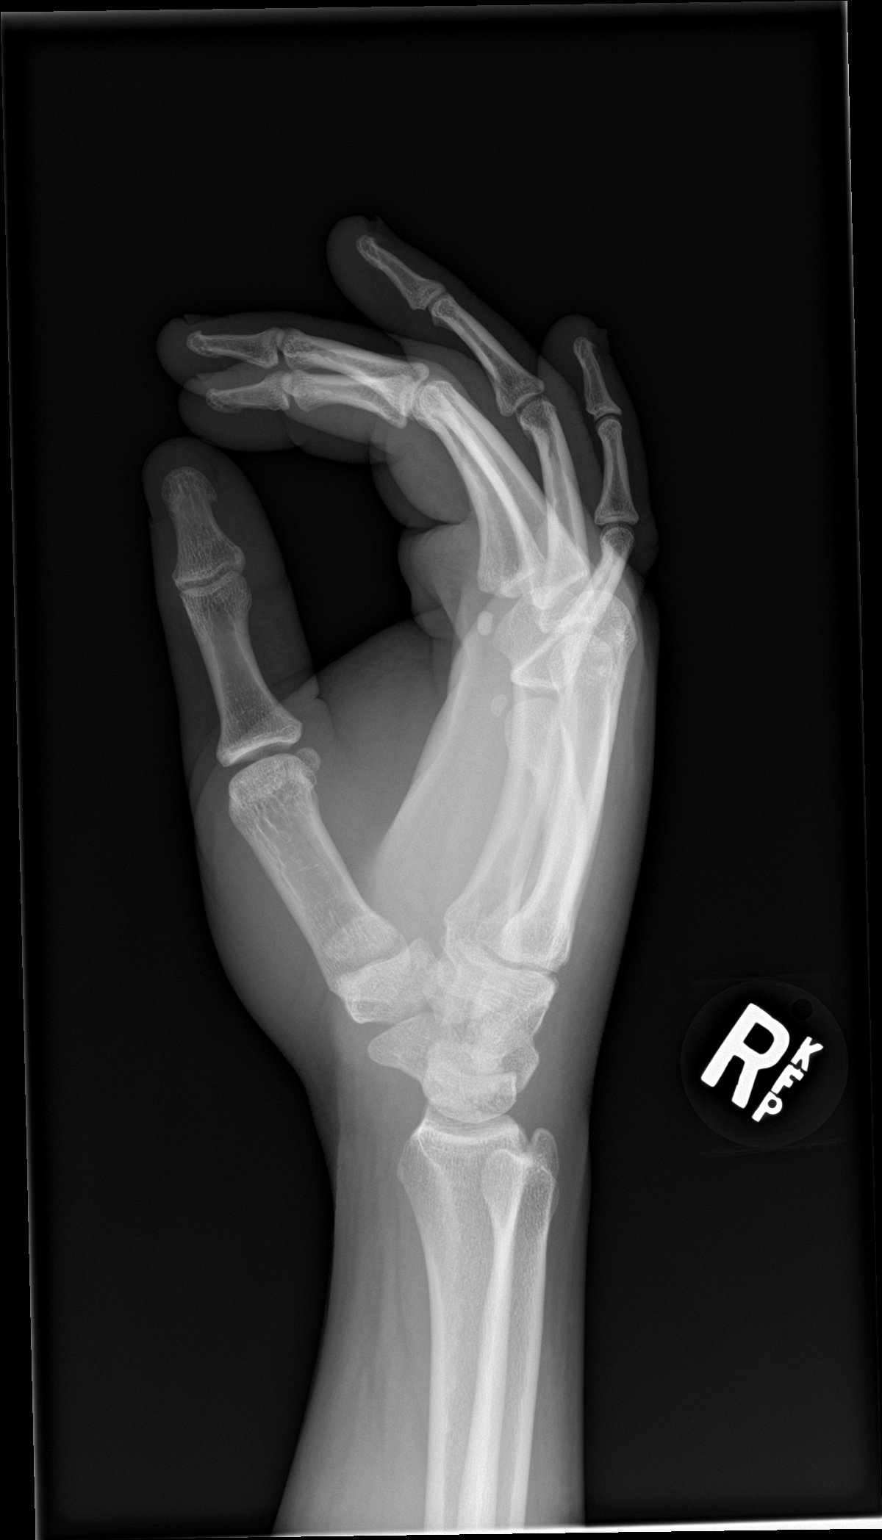

[2 of 2 positions shown; findings below may reference images not displayed]

FINDINGS: An intraarticular fracture at the base of the first metacarpal is
noted with 2 mm displacement.

No other acute fracture identified.

There is no evidence of dislocation.
IMPRESSION: Intraarticular fracture at the base of the first metacarpal.

## 2019-05-18 ENCOUNTER — Encounter (HOSPITAL_COMMUNITY): Payer: Self-pay | Admitting: *Deleted

## 2019-05-18 ENCOUNTER — Encounter (HOSPITAL_BASED_OUTPATIENT_CLINIC_OR_DEPARTMENT_OTHER): Payer: Self-pay | Admitting: *Deleted

## 2019-05-18 ENCOUNTER — Emergency Department (HOSPITAL_COMMUNITY)
Admission: EM | Admit: 2019-05-18 | Discharge: 2019-05-18 | Disposition: A | Discharge status: Home / Self Care | Payer: Self-pay | Attending: Emergency Medicine | Admitting: Emergency Medicine

## 2019-05-18 ENCOUNTER — Emergency Department (HOSPITAL_BASED_OUTPATIENT_CLINIC_OR_DEPARTMENT_OTHER)
Admission: EM | Admit: 2019-05-18 | Discharge: 2019-05-18 | Disposition: A | Discharge status: Home / Self Care | Payer: Medicaid Other | Attending: Emergency Medicine | Admitting: Emergency Medicine

## 2019-05-18 ENCOUNTER — Other Ambulatory Visit: Payer: Self-pay

## 2019-05-18 DIAGNOSIS — F17210 Nicotine dependence, cigarettes, uncomplicated: Secondary | ICD-10-CM | POA: Insufficient documentation

## 2019-05-18 DIAGNOSIS — F1010 Alcohol abuse, uncomplicated: Secondary | ICD-10-CM | POA: Insufficient documentation

## 2019-05-18 DIAGNOSIS — K920 Hematemesis: Secondary | ICD-10-CM | POA: Insufficient documentation

## 2019-05-18 DIAGNOSIS — Z5321 Procedure and treatment not carried out due to patient leaving prior to being seen by health care provider: Secondary | ICD-10-CM | POA: Insufficient documentation

## 2019-05-18 LAB — CBC
HCT: 50.3 % (ref 39.0–52.0)
Hemoglobin: 16.7 g/dL (ref 13.0–17.0)
MCH: 31.8 pg (ref 26.0–34.0)
MCHC: 33.2 g/dL (ref 30.0–36.0)
MCV: 95.8 fL (ref 80.0–100.0)
Platelets: 218 10*3/uL (ref 150–400)
RBC: 5.25 MIL/uL (ref 4.22–5.81)
RDW: 12.5 % (ref 11.5–15.5)
WBC: 6.4 10*3/uL (ref 4.0–10.5)
nRBC: 0 % (ref 0.0–0.2)

## 2019-05-18 LAB — COMPREHENSIVE METABOLIC PANEL
ALT: 26 U/L (ref 0–44)
AST: 32 U/L (ref 15–41)
Albumin: 4.8 g/dL (ref 3.5–5.0)
Alkaline Phosphatase: 46 U/L (ref 38–126)
Anion gap: 14 (ref 5–15)
BUN: 16 mg/dL (ref 6–20)
CO2: 21 mmol/L — ABNORMAL LOW (ref 22–32)
Calcium: 8.9 mg/dL (ref 8.9–10.3)
Chloride: 103 mmol/L (ref 98–111)
Creatinine, Ser: 1.04 mg/dL (ref 0.61–1.24)
GFR calc Af Amer: >60 mL/min (ref >60)
GFR calc non Af Amer: >60 mL/min (ref >60)
Glucose, Bld: 71 mg/dL (ref 70–99)
Potassium: 4.1 mmol/L (ref 3.5–5.1)
Sodium: 138 mmol/L (ref 135–145)
Total Bilirubin: 0.8 mg/dL (ref 0.3–1.2)
Total Protein: 7.9 g/dL (ref 6.5–8.1)

## 2019-05-18 LAB — URINALYSIS, ROUTINE W REFLEX MICROSCOPIC
Bilirubin Urine: NEGATIVE
Glucose, UA: NEGATIVE mg/dL
Hgb urine dipstick: NEGATIVE
Ketones, ur: 80 mg/dL — AB
Nitrite: NEGATIVE
Protein, ur: 30 mg/dL — AB
Specific Gravity, Urine: 1.028 (ref 1.005–1.030)
pH: 5 (ref 5.0–8.0)

## 2019-05-18 LAB — LIPASE, BLOOD: Lipase: 20 U/L (ref 11–51)

## 2019-05-18 MED ORDER — PANTOPRAZOLE SODIUM 40 MG IV SOLR
40.0000 mg | Freq: Once | INTRAVENOUS | Status: AC
  Administered 2019-05-18: 40 mg via INTRAVENOUS
  Filled 2019-05-18: qty 40

## 2019-05-18 MED ORDER — PANTOPRAZOLE SODIUM 20 MG PO TBEC: 20.0000 mg | DELAYED_RELEASE_TABLET | Freq: Every day | ORAL | 0 refills | Status: DC

## 2019-05-18 MED ORDER — ONDANSETRON HCL 4 MG/2ML IJ SOLN
4.0000 mg | Freq: Once | INTRAMUSCULAR | Status: AC
  Administered 2019-05-18: 4 mg via INTRAVENOUS
  Filled 2019-05-18: qty 2

## 2019-05-18 MED ORDER — FAMOTIDINE 20 MG PO TABS
40.0000 mg | ORAL_TABLET | Freq: Once | ORAL | Status: AC
  Administered 2019-05-18: 40 mg via ORAL
  Filled 2019-05-18: qty 2

## 2019-05-18 MED ORDER — SODIUM CHLORIDE 0.9% FLUSH: 3.0000 mL | Freq: Once | INTRAVENOUS | Status: DC

## 2019-05-18 MED ORDER — ONDANSETRON 4 MG PO TBDP: 4.0000 mg | ORAL_TABLET | Freq: Three times a day (TID) | ORAL | 0 refills | Status: DC | PRN

## 2019-05-18 MED ORDER — CHLORDIAZEPOXIDE HCL 25 MG PO CAPS: 50.0000 mg | ORAL_CAPSULE | Freq: Three times a day (TID) | ORAL | 0 refills | Status: DC | PRN

## 2019-05-18 MED FILL — CHLORDIAZEPOXIDE 25 MG CAPS: 25 | 4 days supply | Qty: 20 | Fill #0

## 2019-05-18 MED FILL — ONDANSETRON ODT 4MG TBDP: 4 | 2 days supply | Qty: 6 | Fill #0

## 2019-05-18 MED FILL — PANTOPRAZOLE SOD DR 20 MG T: 20 | 14 days supply | Qty: 14 | Fill #0

## 2019-05-18 NOTE — ED Notes (Signed)
Patient was at [facility] earlier this morning and was Triaged.  He left stating that he has waited for "3 to 4 hours and I can't do it."  He left [facility] ED with intact IV to his left AC.

## 2019-05-18 NOTE — ED Triage Notes (Signed)
Patient stated that he woke up this morning around 0400 vomiting copious amount of blood.  He drinks tequilla almost everyday.  Denies injury.

## 2019-05-18 NOTE — ED Provider Notes (Signed)
[facility] Provider Note   CSN: [id number] Arrival date & time: 05/18/19  7106     History Chief Complaint  Patient presents with  . Hematemesis    Don Perry is a 24 y.o. male.  24 year old male with past medical history below who presents with hematemesis.  Patient drinks approximately 2 pints of liquor daily, states he may be drinking a Shawon Denzer more heavily recently.  Last night he drank alcohol and then had an episode of vomiting prior to going to bed.  He did not think much of it but this morning he was awakened from sleep by vomiting and his first episode of vomiting was bright red blood. He has had multiple episodes of vomiting since then, which seem to be less volume than the initial episode.  More recently the emesis has been more brown rather than bright red.  He denies any abdominal pain, just nausea.  No diarrhea, melena, or hematochezia.  No urinary symptoms or fevers.  This is never happened before.  He denies any heavy NSAID use.  The history is provided by the patient.       Past Medical History:  Diagnosis Date  . Asthma   . Metacarpal bone fracture    right first    Patient Active Problem List   Diagnosis Date Noted  . Sports physical 07/15/2011    Past Surgical History:  Procedure Laterality Date  . OPEN REDUCTION INTERNAL FIXATION (ORIF) METACARPAL Right 10/14/2016   Procedure: OPEN REDUCTION INTERNAL FIXATION (ORIF)  RIGHT FIRST METACARPAL;  Surgeon: Roseanne Kaufman, MD;  Location: [place];  Service: Orthopedics;  Laterality: Right;  . TONSILLECTOMY AND ADENOIDECTOMY         Family History  Problem Relation Age of Onset  . Sudden death Neg Hx   . Heart attack Neg Hx     Social History   Tobacco Use  . Smoking status: Current Every Day Smoker    Packs/day: 0.50    Types: Cigarettes  . Smokeless tobacco: Never Used  Substance Use Topics  . Alcohol use: Yes  . Drug use: Yes    Types: Marijuana    Comment:  last use "  aweek ago" 10/13/16    Home Medications Prior to Admission medications   Medication Sig Start Date End Date Taking? Authorizing Provider  chlordiazePOXIDE (LIBRIUM) 25 MG capsule Take 2 capsules (50 mg total) by mouth 3 (three) times daily as needed for anxiety. 50mg  by mouth q 6 h day 1 50mg  by mouth q 8 h day 2 50mg  by mouth q 12 h day 3 50mg  by mough QHS day 4 05/18/19   Iyesha Such, Wenda Overland, MD  ondansetron (ZOFRAN ODT) 4 MG disintegrating tablet Take 1 tablet (4 mg total) by mouth every 8 (eight) hours as needed for nausea or vomiting. 05/18/19   Raheel Kunkle, Wenda Overland, MD  pantoprazole (PROTONIX) 20 MG tablet Take 1 tablet (20 mg total) by mouth daily. 05/18/19   Lasaro Primm, Wenda Overland, MD    Allergies    Penicillins  Review of Systems   Review of Systems All other systems reviewed and are negative except that which was mentioned in HPI  Physical Exam Updated Vital Signs BP (!) 150/83 (BP Location: Right Arm)   Pulse 84   Temp 97.6 F (36.4 C) (Oral)   Resp 16   Ht 5\' 11"  (1.803 m)   Wt 97.5 kg   SpO2 97%   BMI 29.99 kg/m   Physical  Exam Vitals and nursing note reviewed.  Constitutional:      General: He is not in acute distress.    Appearance: He is well-developed.  HENT:     Head: Normocephalic and atraumatic.     Mouth/Throat:     Mouth: Mucous membranes are moist.     Pharynx: Oropharynx is clear.  Eyes:     Conjunctiva/sclera: Conjunctivae normal.  Cardiovascular:     Rate and Rhythm: Normal rate and regular rhythm.     Heart sounds: Normal heart sounds. No murmur.  Pulmonary:     Effort: Pulmonary effort is normal.     Breath sounds: Normal breath sounds.  Abdominal:     General: Bowel sounds are normal. There is no distension.     Palpations: Abdomen is soft.     Tenderness: There is no abdominal tenderness.  Musculoskeletal:     Cervical back: Neck supple.  Skin:    General: Skin is warm and dry.  Neurological:     Mental Status: He is  alert and oriented to person, place, and time.     Comments: Fluent speech  Psychiatric:        Judgment: Judgment normal.     ED Results / Procedures / Treatments   Labs (all labs ordered are listed, but only abnormal results are displayed) Labs Reviewed - No data to display  EKG None  Radiology No results found.  Procedures Procedures (including critical care time)  Medications Ordered in ED Medications  pantoprazole (PROTONIX) injection 40 mg (40 mg Intravenous Given 05/18/19 1007)  famotidine (PEPCID) tablet 40 mg (40 mg Oral Given 05/18/19 1006)  ondansetron (ZOFRAN) injection 4 mg (4 mg Intravenous Given 05/18/19 1006)    ED Course  I have reviewed the triage vital signs and the nursing notes.  Pertinent labs that were available during my care of the patient were reviewed by me and considered in my medical decision making (see chart for details).    MDM Rules/Calculators/A&P                      Well-appearing on exam, comfortable with no abdominal tenderness.  Stable vital signs.  Patient had initially checked into [facility], ER and left prior to being seen. Pt given protonix, pepcid PO, and zofran. He was able to sip water. He later stated he had a family emergency and had to leave. I spent 10 minutes counseling patient on alcohol cessation and provided w/ librium for withdrawal symptoms.  I have counseled the patient on low acid diet and avoidance of NSAIDs.  Recommended follow-up with PCP.  Extensively reviewed return precautions with him including any worsening symptoms.  He voiced understanding. Final Clinical Impression(s) / ED Diagnoses Final diagnoses:  Hematemesis with nausea  Alcohol abuse    Rx / DC Orders ED Discharge Orders         Ordered    pantoprazole (PROTONIX) 20 MG tablet  Daily     05/18/19 1050    ondansetron (ZOFRAN ODT) 4 MG disintegrating tablet  Every 8 hours PRN     05/18/19 1050    chlordiazePOXIDE (LIBRIUM) 25 MG capsule  3 times  daily PRN     05/18/19 1050           Tayvin Preslar, Ambrose Finland, MD 05/18/19 1052

## 2019-05-18 NOTE — ED Triage Notes (Signed)
Pt arrives from home via [facility], medic reporting he has been vomiting blood x7 (bright red blood, progressed to dark). +ETOH use. 18g in the left arm, given 4 zofran. 136/85, 91hr, 97%ra.

## 2019-05-18 NOTE — ED Notes (Signed)
[facility] taken out of our system, [facility] is [facility] now.

## 2019-05-18 NOTE — ED Notes (Signed)
ED Provider at bedside. 

## 2019-05-18 NOTE — ED Triage Notes (Signed)
Pt says that he started vomiting blood when he woke up about 3 hours ago. He denies pain. Reports he drinks alcohol everyday.

## 2019-08-05 ENCOUNTER — Encounter (HOSPITAL_COMMUNITY): Payer: Self-pay | Admitting: Student

## 2019-08-05 ENCOUNTER — Observation Stay (HOSPITAL_COMMUNITY): Payer: Self-pay

## 2019-08-05 ENCOUNTER — Observation Stay (HOSPITAL_COMMUNITY): Payer: Self-pay | Admitting: Anesthesiology

## 2019-08-05 ENCOUNTER — Emergency Department (HOSPITAL_COMMUNITY): Payer: Self-pay

## 2019-08-05 ENCOUNTER — Other Ambulatory Visit: Payer: Self-pay

## 2019-08-05 ENCOUNTER — Inpatient Hospital Stay (HOSPITAL_COMMUNITY)
Admission: EM | Admit: 2019-08-05 | Discharge: 2019-08-08 | DRG: 465 | Disposition: A | Discharge status: Home / Self Care | Payer: Self-pay | Attending: Student | Admitting: Student

## 2019-08-05 ENCOUNTER — Encounter (HOSPITAL_COMMUNITY)
Admission: EM | Disposition: A | Discharge status: Home / Self Care | Payer: Self-pay | Source: Home / Self Care | Attending: Student

## 2019-08-05 DIAGNOSIS — F1010 Alcohol abuse, uncomplicated: Secondary | ICD-10-CM | POA: Diagnosis present

## 2019-08-05 DIAGNOSIS — Y904 Blood alcohol level of 80-99 mg/100 ml: Secondary | ICD-10-CM | POA: Diagnosis present

## 2019-08-05 DIAGNOSIS — R52 Pain, unspecified: Secondary | ICD-10-CM

## 2019-08-05 DIAGNOSIS — S81012A Laceration without foreign body, left knee, initial encounter: Secondary | ICD-10-CM

## 2019-08-05 DIAGNOSIS — S81011A Laceration without foreign body, right knee, initial encounter: Secondary | ICD-10-CM

## 2019-08-05 DIAGNOSIS — S82001B Unspecified fracture of right patella, initial encounter for open fracture type I or II: Secondary | ICD-10-CM | POA: Diagnosis present

## 2019-08-05 DIAGNOSIS — S82401C Unspecified fracture of shaft of right fibula, initial encounter for open fracture type IIIA, IIIB, or IIIC: Secondary | ICD-10-CM

## 2019-08-05 DIAGNOSIS — S82201B Unspecified fracture of shaft of right tibia, initial encounter for open fracture type I or II: Secondary | ICD-10-CM

## 2019-08-05 DIAGNOSIS — S82201C Unspecified fracture of shaft of right tibia, initial encounter for open fracture type IIIA, IIIB, or IIIC: Secondary | ICD-10-CM

## 2019-08-05 DIAGNOSIS — S51012A Laceration without foreign body of left elbow, initial encounter: Secondary | ICD-10-CM | POA: Diagnosis present

## 2019-08-05 DIAGNOSIS — T148XXA Other injury of unspecified body region, initial encounter: Secondary | ICD-10-CM

## 2019-08-05 DIAGNOSIS — S82251C Displaced comminuted fracture of shaft of right tibia, initial encounter for open fracture type IIIA, IIIB, or IIIC: Secondary | ICD-10-CM

## 2019-08-05 DIAGNOSIS — S82251B Displaced comminuted fracture of shaft of right tibia, initial encounter for open fracture type I or II: Principal | ICD-10-CM | POA: Diagnosis present

## 2019-08-05 DIAGNOSIS — E876 Hypokalemia: Secondary | ICD-10-CM | POA: Diagnosis present

## 2019-08-05 DIAGNOSIS — S82002B Unspecified fracture of left patella, initial encounter for open fracture type I or II: Secondary | ICD-10-CM | POA: Diagnosis present

## 2019-08-05 DIAGNOSIS — T07XXXA Unspecified multiple injuries, initial encounter: Secondary | ICD-10-CM

## 2019-08-05 DIAGNOSIS — S0102XA Laceration with foreign body of scalp, initial encounter: Secondary | ICD-10-CM | POA: Diagnosis present

## 2019-08-05 DIAGNOSIS — Z880 Allergy status to penicillin: Secondary | ICD-10-CM

## 2019-08-05 DIAGNOSIS — Z419 Encounter for procedure for purposes other than remedying health state, unspecified: Secondary | ICD-10-CM

## 2019-08-05 DIAGNOSIS — Z20822 Contact with and (suspected) exposure to covid-19: Secondary | ICD-10-CM | POA: Diagnosis present

## 2019-08-05 DIAGNOSIS — Y92410 Unspecified street and highway as the place of occurrence of the external cause: Secondary | ICD-10-CM

## 2019-08-05 DIAGNOSIS — S82491A Other fracture of shaft of right fibula, initial encounter for closed fracture: Secondary | ICD-10-CM

## 2019-08-05 HISTORY — PX: I & D EXTREMITY: SHX5045

## 2019-08-05 HISTORY — PX: TIBIA IM NAIL INSERTION: SHX2516

## 2019-08-05 HISTORY — PX: IRRIGATION AND DEBRIDEMENT KNEE: SHX5185

## 2019-08-05 LAB — COMPREHENSIVE METABOLIC PANEL
ALT: 36 U/L (ref 0–44)
AST: 38 U/L (ref 15–41)
Albumin: 4.1 g/dL (ref 3.5–5.0)
Alkaline Phosphatase: 46 U/L (ref 38–126)
Anion gap: 12 (ref 5–15)
BUN: 12 mg/dL (ref 6–20)
CO2: 22 mmol/L (ref 22–32)
Calcium: 9.1 mg/dL (ref 8.9–10.3)
Chloride: 105 mmol/L (ref 98–111)
Creatinine, Ser: 1.16 mg/dL (ref 0.61–1.24)
GFR calc Af Amer: >60 mL/min (ref >60)
GFR calc non Af Amer: >60 mL/min (ref >60)
Glucose, Bld: 148 mg/dL — ABNORMAL HIGH (ref 70–99)
Potassium: 3 mmol/L — ABNORMAL LOW (ref 3.5–5.1)
Sodium: 139 mmol/L (ref 135–145)
Total Bilirubin: 0.8 mg/dL (ref 0.3–1.2)
Total Protein: 6.5 g/dL (ref 6.5–8.1)

## 2019-08-05 LAB — SAMPLE TO BLOOD BANK

## 2019-08-05 LAB — I-STAT CHEM 8, ED
BUN: 12 mg/dL (ref 6–20)
Calcium, Ion: 1.18 mmol/L (ref 1.15–1.40)
Chloride: 103 mmol/L (ref 98–111)
Creatinine, Ser: 1.2 mg/dL (ref 0.61–1.24)
Glucose, Bld: 147 mg/dL — ABNORMAL HIGH (ref 70–99)
HCT: 47 % (ref 39.0–52.0)
Hemoglobin: 16 g/dL (ref 13.0–17.0)
Potassium: 3 mmol/L — ABNORMAL LOW (ref 3.5–5.1)
Sodium: 142 mmol/L (ref 135–145)
TCO2: 24 mmol/L (ref 22–32)

## 2019-08-05 LAB — PROTIME-INR
INR: 1 (ref 0.8–1.2)
Prothrombin Time: 12.7 seconds (ref 11.4–15.2)

## 2019-08-05 LAB — SARS CORONAVIRUS 2 BY RT PCR (HOSPITAL ORDER, PERFORMED IN ~~LOC~~ HOSPITAL LAB): SARS Coronavirus 2: NEGATIVE

## 2019-08-05 LAB — CBC
HCT: 46 % (ref 39.0–52.0)
Hemoglobin: 15.6 g/dL (ref 13.0–17.0)
MCH: 32.3 pg (ref 26.0–34.0)
MCHC: 33.9 g/dL (ref 30.0–36.0)
MCV: 95.2 fL (ref 80.0–100.0)
Platelets: 257 10*3/uL (ref 150–400)
RBC: 4.83 MIL/uL (ref 4.22–5.81)
RDW: 12 % (ref 11.5–15.5)
WBC: 11.9 10*3/uL — ABNORMAL HIGH (ref 4.0–10.5)
nRBC: 0 % (ref 0.0–0.2)

## 2019-08-05 LAB — ETHANOL: Alcohol, Ethyl (B): 97 mg/dL — ABNORMAL HIGH (ref <10)

## 2019-08-05 SURGERY — INSERTION, INTRAMEDULLARY ROD, TIBIA
Anesthesia: General | Site: Leg Lower | Laterality: Right

## 2019-08-05 MED ORDER — LABETALOL HCL 5 MG/ML IV SOLN
10.0000 mg | Freq: Once | INTRAVENOUS | Status: AC
  Administered 2019-08-05: 10 mg via INTRAVENOUS

## 2019-08-05 MED ORDER — LACTATED RINGERS IV SOLN: INTRAVENOUS | Status: DC

## 2019-08-05 MED ORDER — MIDAZOLAM HCL 2 MG/2ML IJ SOLN
INTRAMUSCULAR | Status: AC
  Filled 2019-08-05: qty 2

## 2019-08-05 MED ORDER — OXYCODONE HCL 5 MG PO TABS
10.0000 mg | ORAL_TABLET | ORAL | Status: DC | PRN
  Administered 2019-08-05: 10 mg via ORAL
  Administered 2019-08-05 – 2019-08-08 (×5): 15 mg via ORAL
  Filled 2019-08-05 (×5): qty 3

## 2019-08-05 MED ORDER — FENTANYL CITRATE (PF) 100 MCG/2ML IJ SOLN
INTRAMUSCULAR | Status: AC
  Filled 2019-08-05: qty 2

## 2019-08-05 MED ORDER — HYDROMORPHONE HCL 1 MG/ML IJ SOLN
0.5000 mg | INTRAMUSCULAR | Status: DC | PRN
  Administered 2019-08-05 – 2019-08-08 (×11): 1 mg via INTRAVENOUS
  Filled 2019-08-05 (×11): qty 1

## 2019-08-05 MED ORDER — METHOCARBAMOL 1000 MG/10ML IJ SOLN
500.0000 mg | Freq: Four times a day (QID) | INTRAVENOUS | Status: DC | PRN
  Filled 2019-08-05: qty 5

## 2019-08-05 MED ORDER — ROCURONIUM BROMIDE 10 MG/ML (PF) SYRINGE
PREFILLED_SYRINGE | INTRAVENOUS | Status: DC | PRN
  Administered 2019-08-05: 60 mg via INTRAVENOUS
  Administered 2019-08-05: 30 mg via INTRAVENOUS
  Administered 2019-08-05: 40 mg via INTRAVENOUS
  Administered 2019-08-05: 10 mg via INTRAVENOUS

## 2019-08-05 MED ORDER — SUCCINYLCHOLINE CHLORIDE 200 MG/10ML IV SOSY
PREFILLED_SYRINGE | INTRAVENOUS | Status: AC
  Filled 2019-08-05: qty 10

## 2019-08-05 MED ORDER — VANCOMYCIN HCL IN DEXTROSE 1-5 GM/200ML-% IV SOLN: 1000.0000 mg | INTRAVENOUS | Status: AC

## 2019-08-05 MED ORDER — ONDANSETRON HCL 4 MG PO TABS
4.0000 mg | ORAL_TABLET | Freq: Four times a day (QID) | ORAL | Status: DC | PRN
  Filled 2019-08-05: qty 1

## 2019-08-05 MED ORDER — ROCURONIUM BROMIDE 10 MG/ML (PF) SYRINGE
PREFILLED_SYRINGE | INTRAVENOUS | Status: AC
  Filled 2019-08-05: qty 20

## 2019-08-05 MED ORDER — SODIUM CHLORIDE 0.9 % IV SOLN
2.0000 g | Freq: Three times a day (TID) | INTRAVENOUS | Status: AC
  Administered 2019-08-05 – 2019-08-08 (×9): 2 g via INTRAVENOUS
  Filled 2019-08-05 (×9): qty 2

## 2019-08-05 MED ORDER — OXYCODONE HCL 5 MG PO TABS: 5.0000 mg | ORAL_TABLET | ORAL | Status: DC | PRN

## 2019-08-05 MED ORDER — CLINDAMYCIN PHOSPHATE 900 MG/50ML IV SOLN
900.0000 mg | INTRAVENOUS | Status: AC
  Administered 2019-08-05: 900 mg via INTRAVENOUS
  Filled 2019-08-05: qty 50

## 2019-08-05 MED ORDER — FENTANYL CITRATE (PF) 250 MCG/5ML IJ SOLN
INTRAMUSCULAR | Status: AC
  Filled 2019-08-05: qty 5

## 2019-08-05 MED ORDER — FENTANYL CITRATE (PF) 100 MCG/2ML IJ SOLN
100.0000 ug | Freq: Once | INTRAMUSCULAR | Status: AC
  Administered 2019-08-05: 100 ug via INTRAVENOUS
  Filled 2019-08-05: qty 2

## 2019-08-05 MED ORDER — VANCOMYCIN HCL 1000 MG IV SOLR
INTRAVENOUS | Status: DC | PRN
  Administered 2019-08-05: 1000 mg

## 2019-08-05 MED ORDER — ACETAMINOPHEN 500 MG PO TABS
1000.0000 mg | ORAL_TABLET | Freq: Once | ORAL | Status: AC
  Administered 2019-08-05: 1000 mg via ORAL
  Filled 2019-08-05: qty 2

## 2019-08-05 MED ORDER — ONDANSETRON HCL 4 MG/2ML IJ SOLN
4.0000 mg | Freq: Four times a day (QID) | INTRAMUSCULAR | Status: DC | PRN
  Administered 2019-08-05: 4 mg via INTRAVENOUS
  Filled 2019-08-05 (×2): qty 2

## 2019-08-05 MED ORDER — LIDOCAINE 2% (20 MG/ML) 5 ML SYRINGE
INTRAMUSCULAR | Status: DC | PRN
  Administered 2019-08-05: 80 mg via INTRAVENOUS

## 2019-08-05 MED ORDER — DEXAMETHASONE SODIUM PHOSPHATE 10 MG/ML IJ SOLN
INTRAMUSCULAR | Status: DC | PRN
  Administered 2019-08-05: 10 mg via INTRAVENOUS

## 2019-08-05 MED ORDER — DOCUSATE SODIUM 100 MG PO CAPS
100.0000 mg | ORAL_CAPSULE | Freq: Two times a day (BID) | ORAL | Status: DC
  Administered 2019-08-05 – 2019-08-08 (×4): 100 mg via ORAL
  Filled 2019-08-05 (×5): qty 1

## 2019-08-05 MED ORDER — TOBRAMYCIN SULFATE 1.2 G IJ SOLR
INTRAMUSCULAR | Status: AC
  Filled 2019-08-05: qty 1.2

## 2019-08-05 MED ORDER — HYDROMORPHONE HCL 1 MG/ML IJ SOLN
0.2500 mg | INTRAMUSCULAR | Status: DC | PRN
  Administered 2019-08-05: 0.5 mg via INTRAVENOUS

## 2019-08-05 MED ORDER — DEXAMETHASONE SODIUM PHOSPHATE 10 MG/ML IJ SOLN
INTRAMUSCULAR | Status: AC
  Filled 2019-08-05: qty 2

## 2019-08-05 MED ORDER — SUCCINYLCHOLINE CHLORIDE 200 MG/10ML IV SOSY
PREFILLED_SYRINGE | INTRAVENOUS | Status: DC | PRN
  Administered 2019-08-05: 120 mg via INTRAVENOUS

## 2019-08-05 MED ORDER — TOBRAMYCIN SULFATE 1.2 G IJ SOLR
INTRAMUSCULAR | Status: DC | PRN
  Administered 2019-08-05: 1.2 g

## 2019-08-05 MED ORDER — SUGAMMADEX SODIUM 200 MG/2ML IV SOLN
INTRAVENOUS | Status: DC | PRN
Start: 2019-08-05 — End: 2019-08-05
  Administered 2019-08-05: 200 mg via INTRAVENOUS

## 2019-08-05 MED ORDER — LIDOCAINE 2% (20 MG/ML) 5 ML SYRINGE
INTRAMUSCULAR | Status: AC
  Filled 2019-08-05: qty 10

## 2019-08-05 MED ORDER — TOBRAMYCIN SULFATE 1.2 G IJ SOLR
INTRAMUSCULAR | Status: AC
  Filled 2019-08-05: qty 2.4

## 2019-08-05 MED ORDER — 0.9 % SODIUM CHLORIDE (POUR BTL) OPTIME
TOPICAL | Status: DC | PRN
  Administered 2019-08-05: 1000 mL

## 2019-08-05 MED ORDER — ENOXAPARIN SODIUM 40 MG/0.4ML ~~LOC~~ SOLN
40.0000 mg | SUBCUTANEOUS | Status: DC
  Administered 2019-08-06: 40 mg via SUBCUTANEOUS
  Filled 2019-08-05 (×4): qty 0.4

## 2019-08-05 MED ORDER — GABAPENTIN 100 MG PO CAPS
100.0000 mg | ORAL_CAPSULE | Freq: Three times a day (TID) | ORAL | Status: DC
  Administered 2019-08-05 – 2019-08-08 (×9): 100 mg via ORAL
  Filled 2019-08-05 (×9): qty 1

## 2019-08-05 MED ORDER — HYDROMORPHONE HCL 1 MG/ML IJ SOLN
INTRAMUSCULAR | Status: DC | PRN
  Administered 2019-08-05 (×2): .5 mg via INTRAVENOUS

## 2019-08-05 MED ORDER — POLYETHYLENE GLYCOL 3350 17 G PO PACK: 17.0000 g | PACK | Freq: Every day | ORAL | Status: DC | PRN

## 2019-08-05 MED ORDER — TETANUS-DIPHTH-ACELL PERTUSSIS 5-2.5-18.5 LF-MCG/0.5 IM SUSP
0.5000 mL | Freq: Once | INTRAMUSCULAR | Status: AC
  Administered 2019-08-05: 0.5 mL via INTRAMUSCULAR
  Filled 2019-08-05: qty 0.5

## 2019-08-05 MED ORDER — FENTANYL CITRATE (PF) 100 MCG/2ML IJ SOLN
INTRAMUSCULAR | Status: AC
  Administered 2019-08-05: 100 ug via INTRAVENOUS
  Filled 2019-08-05: qty 2

## 2019-08-05 MED ORDER — MORPHINE SULFATE (PF) 2 MG/ML IV SOLN: 2.0000 mg | INTRAVENOUS | Status: DC | PRN

## 2019-08-05 MED ORDER — HYDROMORPHONE HCL 1 MG/ML IJ SOLN
INTRAMUSCULAR | Status: AC
  Filled 2019-08-05: qty 0.5

## 2019-08-05 MED ORDER — POTASSIUM CHLORIDE 10 MEQ/100ML IV SOLN
10.0000 meq | Freq: Once | INTRAVENOUS | Status: AC
  Administered 2019-08-05: 10 meq via INTRAVENOUS
  Filled 2019-08-05: qty 100

## 2019-08-05 MED ORDER — CHLORHEXIDINE GLUCONATE 0.12 % MT SOLN
OROMUCOSAL | Status: AC
  Administered 2019-08-05: 15 mL via OROMUCOSAL
  Filled 2019-08-05: qty 15

## 2019-08-05 MED ORDER — POTASSIUM CHLORIDE IN NACL 20-0.9 MEQ/L-% IV SOLN
INTRAVENOUS | Status: DC
  Filled 2019-08-05 (×2): qty 1000

## 2019-08-05 MED ORDER — ONDANSETRON HCL 4 MG/2ML IJ SOLN
INTRAMUSCULAR | Status: AC
  Filled 2019-08-05: qty 4

## 2019-08-05 MED ORDER — FENTANYL CITRATE (PF) 100 MCG/2ML IJ SOLN: 100.0000 ug | Freq: Once | INTRAMUSCULAR | Status: AC

## 2019-08-05 MED ORDER — METOCLOPRAMIDE HCL 5 MG/ML IJ SOLN: 5.0000 mg | Freq: Three times a day (TID) | INTRAMUSCULAR | Status: DC | PRN

## 2019-08-05 MED ORDER — ROCURONIUM BROMIDE 10 MG/ML (PF) SYRINGE
PREFILLED_SYRINGE | INTRAVENOUS | Status: AC
  Filled 2019-08-05: qty 10

## 2019-08-05 MED ORDER — CHLORHEXIDINE GLUCONATE 4 % EX LIQD: 60.0000 mL | Freq: Once | CUTANEOUS | Status: DC

## 2019-08-05 MED ORDER — FENTANYL CITRATE (PF) 100 MCG/2ML IJ SOLN
INTRAMUSCULAR | Status: DC | PRN
  Administered 2019-08-05 (×3): 50 ug via INTRAVENOUS
  Administered 2019-08-05: 100 ug via INTRAVENOUS
  Administered 2019-08-05 (×3): 50 ug via INTRAVENOUS
  Administered 2019-08-05: 100 ug via INTRAVENOUS

## 2019-08-05 MED ORDER — POVIDONE-IODINE 10 % EX SWAB: 2.0000 "application " | Freq: Once | CUTANEOUS | Status: DC

## 2019-08-05 MED ORDER — ACETAMINOPHEN 500 MG PO TABS
1000.0000 mg | ORAL_TABLET | Freq: Four times a day (QID) | ORAL | Status: DC
  Administered 2019-08-05 – 2019-08-08 (×10): 1000 mg via ORAL
  Filled 2019-08-05 (×10): qty 2

## 2019-08-05 MED ORDER — MIDAZOLAM HCL 5 MG/5ML IJ SOLN
INTRAMUSCULAR | Status: DC | PRN
  Administered 2019-08-05: 2 mg via INTRAVENOUS

## 2019-08-05 MED ORDER — LIDOCAINE-EPINEPHRINE (PF) 2 %-1:200000 IJ SOLN
20.0000 mL | Freq: Once | INTRAMUSCULAR | Status: AC
  Administered 2019-08-05: 20 mL
  Filled 2019-08-05: qty 20

## 2019-08-05 MED ORDER — PROPOFOL 10 MG/ML IV BOLUS
INTRAVENOUS | Status: AC
  Filled 2019-08-05: qty 20

## 2019-08-05 MED ORDER — LABETALOL HCL 5 MG/ML IV SOLN
INTRAVENOUS | Status: AC
  Filled 2019-08-05: qty 4

## 2019-08-05 MED ORDER — IOHEXOL 300 MG/ML  SOLN
100.0000 mL | Freq: Once | INTRAMUSCULAR | Status: AC | PRN
  Administered 2019-08-05: 100 mL via INTRAVENOUS

## 2019-08-05 MED ORDER — OXYCODONE HCL 5 MG PO TABS
5.0000 mg | ORAL_TABLET | ORAL | Status: DC | PRN
  Filled 2019-08-05: qty 2

## 2019-08-05 MED ORDER — VANCOMYCIN HCL IN DEXTROSE 1-5 GM/200ML-% IV SOLN
INTRAVENOUS | Status: AC
  Administered 2019-08-05: 1000 mg via INTRAVENOUS
  Filled 2019-08-05: qty 200

## 2019-08-05 MED ORDER — CHLORHEXIDINE GLUCONATE 0.12 % MT SOLN
15.0000 mL | OROMUCOSAL | Status: AC
  Filled 2019-08-05: qty 15

## 2019-08-05 MED ORDER — METHOCARBAMOL 500 MG PO TABS
500.0000 mg | ORAL_TABLET | Freq: Four times a day (QID) | ORAL | Status: DC | PRN
  Administered 2019-08-06 – 2019-08-08 (×5): 500 mg via ORAL
  Filled 2019-08-05 (×6): qty 1

## 2019-08-05 MED ORDER — HYDRALAZINE HCL 10 MG PO TABS: 10.0000 mg | ORAL_TABLET | Freq: Four times a day (QID) | ORAL | Status: DC | PRN

## 2019-08-05 MED ORDER — VANCOMYCIN HCL 1000 MG IV SOLR
INTRAVENOUS | Status: AC
  Filled 2019-08-05: qty 1000

## 2019-08-05 MED ORDER — PROPOFOL 10 MG/ML IV BOLUS
INTRAVENOUS | Status: DC | PRN
  Administered 2019-08-05: 150 mg via INTRAVENOUS

## 2019-08-05 MED ORDER — CLINDAMYCIN PHOSPHATE 900 MG/50ML IV SOLN
900.0000 mg | Freq: Three times a day (TID) | INTRAVENOUS | Status: AC
  Administered 2019-08-05 – 2019-08-08 (×9): 900 mg via INTRAVENOUS
  Filled 2019-08-05 (×9): qty 50

## 2019-08-05 MED ORDER — METOCLOPRAMIDE HCL 5 MG PO TABS: 5.0000 mg | ORAL_TABLET | Freq: Three times a day (TID) | ORAL | Status: DC | PRN

## 2019-08-05 MED ORDER — SODIUM CHLORIDE 0.9 % IR SOLN
Status: DC | PRN
  Administered 2019-08-05 (×3): 3000 mL

## 2019-08-05 MED ORDER — HYDROMORPHONE HCL 1 MG/ML IJ SOLN
INTRAMUSCULAR | Status: AC
  Filled 2019-08-05: qty 1

## 2019-08-05 MED ORDER — VANCOMYCIN HCL 1000 MG IV SOLR
INTRAVENOUS | Status: AC
  Filled 2019-08-05: qty 2000

## 2019-08-05 MED ORDER — PHENYLEPHRINE 40 MCG/ML (10ML) SYRINGE FOR IV PUSH (FOR BLOOD PRESSURE SUPPORT)
PREFILLED_SYRINGE | INTRAVENOUS | Status: AC
  Filled 2019-08-05: qty 10

## 2019-08-05 SURGICAL SUPPLY — 85 items
BIT DRILL CALIBRATED 4.2 (BIT) ×2 IMPLANT
BIT DRILL SHORT 4.2 (BIT) ×4 IMPLANT
BLADE SURG 10 STRL SS (BLADE) IMPLANT
BNDG COHESIVE 4X5 TAN STRL (GAUZE/BANDAGES/DRESSINGS) IMPLANT
BNDG ELASTIC 4X5.8 VLCR STR LF (GAUZE/BANDAGES/DRESSINGS) IMPLANT
BNDG ELASTIC 6X10 VLCR STRL LF (GAUZE/BANDAGES/DRESSINGS) ×8 IMPLANT
BNDG ELASTIC 6X5.8 VLCR STR LF (GAUZE/BANDAGES/DRESSINGS) IMPLANT
BNDG GAUZE ELAST 4 BULKY (GAUZE/BANDAGES/DRESSINGS) IMPLANT
BRUSH SCRUB EZ PLAIN DRY (MISCELLANEOUS) ×8 IMPLANT
CANISTER WOUNDNEG PRESSURE 500 (CANNISTER) ×4 IMPLANT
CHLORAPREP W/TINT 26 (MISCELLANEOUS) ×12 IMPLANT
COVER MAYO STAND STRL (DRAPES) ×4 IMPLANT
COVER SURGICAL LIGHT HANDLE (MISCELLANEOUS) ×4 IMPLANT
COVER WAND RF STERILE (DRAPES) IMPLANT
DRAPE C-ARM 42X72 X-RAY (DRAPES) ×4 IMPLANT
DRAPE C-ARMOR (DRAPES) ×4 IMPLANT
DRAPE EXTREMITY BILATERAL (DRAPES) ×4 IMPLANT
DRAPE HALF SHEET 40X57 (DRAPES) ×8 IMPLANT
DRAPE IMP U-DRAPE 54X76 (DRAPES) ×8 IMPLANT
DRAPE INCISE IOBAN 66X45 STRL (DRAPES) IMPLANT
DRAPE ORTHO SPLIT 77X108 STRL (DRAPES) ×8
DRAPE SURG 17X23 STRL (DRAPES) IMPLANT
DRAPE SURG ORHT 6 SPLT 77X108 (DRAPES) ×4 IMPLANT
DRAPE U-SHAPE 47X51 STRL (DRAPES) ×8 IMPLANT
DRILL BIT CALIBRATED 4.2 (BIT) ×4
DRILL BIT SHORT 4.2 (BIT) ×8
DRSG ADAPTIC 3X8 NADH LF (GAUZE/BANDAGES/DRESSINGS) IMPLANT
DRSG MEPITEL 4X7.2 (GAUZE/BANDAGES/DRESSINGS) ×4 IMPLANT
DRSG MEPITEL 8X12 (GAUZE/BANDAGES/DRESSINGS) ×3 IMPLANT
ELECT REM PT RETURN 9FT ADLT (ELECTROSURGICAL) ×4
ELECTRODE REM PT RTRN 9FT ADLT (ELECTROSURGICAL) ×2 IMPLANT
EVACUATOR 1/8 PVC DRAIN (DRAIN) IMPLANT
GAUZE SPONGE 4X4 12PLY STRL (GAUZE/BANDAGES/DRESSINGS) IMPLANT
GAUZE SPONGE 4X4 12PLY STRL LF (GAUZE/BANDAGES/DRESSINGS) ×8 IMPLANT
GLOVE BIO SURGEON STRL SZ 6.5 (GLOVE) ×9 IMPLANT
GLOVE BIO SURGEON STRL SZ7.5 (GLOVE) ×24 IMPLANT
GLOVE BIO SURGEONS STRL SZ 6.5 (GLOVE) ×3
GLOVE BIOGEL PI IND STRL 6.5 (GLOVE) ×2 IMPLANT
GLOVE BIOGEL PI IND STRL 7.5 (GLOVE) ×4 IMPLANT
GLOVE BIOGEL PI INDICATOR 6.5 (GLOVE) ×2
GLOVE BIOGEL PI INDICATOR 7.5 (GLOVE) ×4
GOWN STRL REUS W/ TWL LRG LVL3 (GOWN DISPOSABLE) ×10 IMPLANT
GOWN STRL REUS W/TWL LRG LVL3 (GOWN DISPOSABLE) ×20
GUIDEWIRE 3.2X400 (WIRE) ×4 IMPLANT
HANDPIECE INTERPULSE COAX TIP (DISPOSABLE) ×4
KIT BASIN OR (CUSTOM PROCEDURE TRAY) ×4 IMPLANT
KIT PREVENA INCISION MGT 13 (CANNISTER) ×4 IMPLANT
KIT TURNOVER KIT B (KITS) ×4 IMPLANT
MANIFOLD NEPTUNE II (INSTRUMENTS) ×4 IMPLANT
MEPITEL 8X12 (GAUZE/BANDAGES/DRESSINGS) ×1
NAIL TIB 10X390 (Nail) ×4 IMPLANT
NS IRRIG 1000ML POUR BTL (IV SOLUTION) ×4 IMPLANT
PACK ORTHO EXTREMITY (CUSTOM PROCEDURE TRAY) ×4 IMPLANT
PACK TOTAL JOINT (CUSTOM PROCEDURE TRAY) ×4 IMPLANT
PAD ABD 8X10 STRL (GAUZE/BANDAGES/DRESSINGS) ×8 IMPLANT
PAD ARMBOARD 7.5X6 YLW CONV (MISCELLANEOUS) ×4 IMPLANT
PAD CAST 4YDX4 CTTN HI CHSV (CAST SUPPLIES) ×4 IMPLANT
PADDING CAST COTTON 4X4 STRL (CAST SUPPLIES) ×8
PADDING CAST COTTON 6X4 STRL (CAST SUPPLIES) ×8 IMPLANT
PENCIL BUTTON HOLSTER BLD 10FT (ELECTRODE) ×4 IMPLANT
REAMER ROD DEEP FLUTE 2.5X950 (INSTRUMENTS) ×4 IMPLANT
SCREW LOCK STAR 5X34 (Screw) ×4 IMPLANT
SCREW LOCK STAR 5X38 (Screw) ×4 IMPLANT
SCREW LOCK STAR 5X40 (Screw) ×4 IMPLANT
SCREW LOCK STAR 5X76 (Screw) ×4 IMPLANT
SET HNDPC FAN SPRY TIP SCT (DISPOSABLE) ×2 IMPLANT
SPONGE LAP 18X18 RF (DISPOSABLE) IMPLANT
STAPLER VISISTAT 35W (STAPLE) IMPLANT
SUT ETHILON 2 0 FS 18 (SUTURE) IMPLANT
SUT ETHILON 3 0 PS 1 (SUTURE) ×16 IMPLANT
SUT MNCRL AB 3-0 PS2 18 (SUTURE) IMPLANT
SUT MON AB 2-0 CT1 36 (SUTURE) ×12 IMPLANT
SUT PDS AB 0 CT 36 (SUTURE) IMPLANT
SUT VIC AB 0 CT1 27 (SUTURE) ×8
SUT VIC AB 0 CT1 27XBRD ANBCTR (SUTURE) ×4 IMPLANT
SUT VIC AB 2-0 CT1 27 (SUTURE)
SUT VIC AB 2-0 CT1 TAPERPNT 27 (SUTURE) IMPLANT
SWAB CULTURE ESWAB REG 1ML (MISCELLANEOUS) IMPLANT
TOWEL GREEN STERILE (TOWEL DISPOSABLE) ×8 IMPLANT
TOWEL GREEN STERILE FF (TOWEL DISPOSABLE) ×4 IMPLANT
TUBE CONNECTING 12'X1/4 (SUCTIONS) ×2
TUBE CONNECTING 12X1/4 (SUCTIONS) ×6 IMPLANT
UNDERPAD 30X36 HEAVY ABSORB (UNDERPADS AND DIAPERS) ×8 IMPLANT
WATER STERILE IRR 1000ML POUR (IV SOLUTION) IMPLANT
YANKAUER SUCT BULB TIP NO VENT (SUCTIONS) ×4 IMPLANT

## 2019-08-05 NOTE — Anesthesia Preprocedure Evaluation (Addendum)
Anesthesia Evaluation  Patient identified by MRN, date of birth, ID band Patient awake    Reviewed: Allergy & Precautions, H&P , NPO status , Patient's Chart, lab work & pertinent test results  Airway Mallampati: III  TM Distance: >3 FB Neck ROM: Full    Dental no notable dental hx. (+) Teeth Intact, Dental Advisory Given   Pulmonary neg pulmonary ROS,    Pulmonary exam normal breath sounds clear to auscultation       Cardiovascular negative cardio ROS   Rhythm:Regular Rate:Normal     Neuro/Psych negative neurological ROS  negative psych ROS   GI/Hepatic negative GI ROS, Neg liver ROS,   Endo/Other  negative endocrine ROS  Renal/GU negative Renal ROS  negative genitourinary   Musculoskeletal   Abdominal   Peds  Hematology negative hematology ROS (+)   Anesthesia Other Findings   Reproductive/Obstetrics negative OB ROS                            Anesthesia Physical Anesthesia Plan  ASA: I  Anesthesia Plan: General   Post-op Pain Management:    Induction: Intravenous  PONV Risk Score and Plan: 3 and Ondansetron, Dexamethasone and Midazolam  Airway Management Planned: Oral ETT  Additional Equipment:   Intra-op Plan:   Post-operative Plan: Extubation in OR  Informed Consent: I have reviewed the patients History and Physical, chart, labs and discussed the procedure including the risks, benefits and alternatives for the proposed anesthesia with the patient or authorized representative who has indicated his/her understanding and acceptance.     Dental advisory given  Plan Discussed with: CRNA  Anesthesia Plan Comments:         Anesthesia Quick Evaluation  
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
## 2019-08-05 NOTE — Progress Notes (Signed)
Discussed pt with EDP.  Has open tib-fib.  Open fracture abx protocol given.  Will need trauma scans.  Will need operative treatment today.  Keep NPO.  Will discuss with ortho trauma.
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
## 2019-08-05 NOTE — ED Notes (Signed)
Suture cart outside pts room.

## 2019-08-05 NOTE — Anesthesia Procedure Notes (Signed)
Procedure Name: Intubation Date/Time: 08/05/2019 11:11 AM Performed by: Candis Shine, CRNA Pre-anesthesia Checklist: Patient identified, Emergency Drugs available, Suction available and Patient being monitored Patient Re-evaluated:Patient Re-evaluated prior to induction Oxygen Delivery Method: Circle System Utilized Preoxygenation: Pre-oxygenation with 100% oxygen Induction Type: IV induction, Rapid sequence and Cricoid Pressure applied Laryngoscope Size: Mac and 4 Grade View: Grade I Tube type: Oral Tube size: 7.5 mm Number of attempts: 1 Airway Equipment and Method: Stylet Placement Confirmation: ETT inserted through vocal cords under direct vision,  positive ETCO2 and breath sounds checked- equal and bilateral Secured at: 22 cm Tube secured with: Tape Dental Injury: Teeth and Oropharynx as per pre-operative assessment

## 2019-08-05 NOTE — ED Notes (Signed)
Pt visitor at bedside.

## 2019-08-05 NOTE — Anesthesia Postprocedure Evaluation (Signed)
Anesthesia Post Note  Patient: Don Perry  Procedure(s) Performed: INTRAMEDULLARY (IM) NAIL TIBIAL (Right Leg Lower) IRRIGATION AND DEBRIDEMENT EXTREMITY (Right Leg Lower) IRRIGATION AND DEBRIDEMENT KNEE (Left Knee)     Patient location during evaluation: PACU Anesthesia Type: General Level of consciousness: awake and alert Pain management: pain level controlled Vital Signs Assessment: post-procedure vital signs reviewed and stable Respiratory status: spontaneous breathing, nonlabored ventilation and respiratory function stable Cardiovascular status: blood pressure returned to baseline and stable Postop Assessment: no apparent nausea or vomiting Anesthetic complications: no   No complications documented.  Last Vitals:  Vitals:   08/05/19 1430 08/05/19 1445  BP: (!) 177/105 (!) 187/117  Pulse: 98 (!) 119  Resp: (!) 28 19  Temp:    SpO2: 99% 94%    Last Pain:  Vitals:   08/05/19 1445  TempSrc:   PainSc: Asleep                 Lowella Curb
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
## 2019-08-05 NOTE — Progress Notes (Signed)
Orthopedic Tech Progress Note Patient Details:  Don Perry [birth year] [id number] Applied over head trapeze to patient bed and delivered cam walker boot to patient.  Ortho Devices Type of Ortho Device: CAM walker Ortho Device/Splint Location: Upper Right Extremity Ortho Device/Splint Interventions: Ordered   Post Interventions Instructions Provided: Care of device, Poper ambulation with device   Alpha Mysliwiec Clarene Reamer 08/05/2019, 8:16 PM

## 2019-08-05 NOTE — Plan of Care (Signed)

## 2019-08-05 NOTE — Discharge Instructions (Signed)
Orthopaedic Trauma Service Discharge Instructions   General Discharge Instructions  WEIGHT BEARING STATUS: Non-weightbearing right lower extremity. Weightbearing as tolerated left lower extremity  RANGE OF MOTION/ACTIVITY: Okay for range of motion bilateral lower extremities  Wound Care: Leave wound vac in place. All other incisions can be left open to air if there is no drainage. If incision continues to have drainage, follow wound care instructions below. Okay to shower if no drainage from incisions.  DVT/PE prophylaxis: Aspirin 325 mg twice daily x 30 days  Diet: as you were eating previously.  Can use over the counter stool softeners and bowel preparations, such as Miralax, to help with bowel movements.  Narcotics can be constipating.  Be sure to drink plenty of fluids  PAIN MEDICATION USE AND EXPECTATIONS  You have likely been given narcotic medications to help control your pain.  After a traumatic event that results in an fracture (broken bone) with or without surgery, it is ok to use narcotic pain medications to help control one's pain.  We understand that everyone responds to pain differently and each individual patient will be evaluated on a regular basis for the continued need for narcotic medications. Ideally, narcotic medication use should last no more than 6-8 weeks (coinciding with fracture healing).   As a patient it is your responsibility as well to monitor narcotic medication use and report the amount and frequency you use these medications when you come to your office visit.   We would also advise that if you are using narcotic medications, you should take a dose prior to therapy to maximize you participation.  IF YOU ARE ON NARCOTIC MEDICATIONS IT IS NOT PERMISSIBLE TO OPERATE A MOTOR VEHICLE (MOTORCYCLE/CAR/TRUCK/MOPED) OR HEAVY MACHINERY DO NOT MIX NARCOTICS WITH OTHER CNS (CENTRAL NERVOUS SYSTEM) DEPRESSANTS SUCH AS ALCOHOL   STOP SMOKING OR USING NICOTINE  PRODUCTS!!!!  As discussed nicotine severely impairs your body's ability to heal surgical and traumatic wounds but also impairs bone healing.  Wounds and bone heal by forming microscopic blood vessels (angiogenesis) and nicotine is a vasoconstrictor (essentially, shrinks blood vessels).  Therefore, if vasoconstriction occurs to these microscopic blood vessels they essentially disappear and are unable to deliver necessary nutrients to the healing tissue.  This is one modifiable factor that you can do to dramatically increase your chances of healing your injury.    (This means no smoking, no nicotine gum, patches, etc)  DO NOT USE NONSTEROIDAL ANTI-INFLAMMATORY DRUGS (NSAID'S)  Using products such as Advil (ibuprofen), Aleve (naproxen), Motrin (ibuprofen) for additional pain control during fracture healing can delay and/or prevent the healing response.  If you would like to take over the counter (OTC) medication, Tylenol (acetaminophen) is ok.  However, some narcotic medications that are given for pain control contain acetaminophen as well. Therefore, you should not exceed more than 4000 mg of tylenol in a day if you do not have liver disease.  Also note that there are may OTC medicines, such as cold medicines and allergy medicines that my contain tylenol as well.  If you have any questions about medications and/or interactions please ask your doctor/PA or your pharmacist.      ICE AND ELEVATE INJURED/OPERATIVE EXTREMITY  Using ice and elevating the injured extremity above your heart can help with swelling and pain control.  Icing in a pulsatile fashion, such as 20 minutes on and 20 minutes off, can be followed.    Do not place ice directly on skin. Make sure there is a barrier  between to skin and the ice pack.    Using frozen items such as frozen peas works well as the conform nicely to the are that needs to be iced.  USE AN ACE WRAP OR TED HOSE FOR SWELLING CONTROL  In addition to icing and elevation,  Ace wraps or TED hose are used to help limit and resolve swelling.  It is recommended to use Ace wraps or TED hose until you are informed to stop.    When using Ace Wraps start the wrapping distally (farthest away from the body) and wrap proximally (closer to the body)   Example: If you had surgery on your leg or thing and you do not have a splint on, start the ace wrap at the toes and work your way up to the thigh        If you had surgery on your upper extremity and do not have a splint on, start the ace wrap at your fingers and work your way up to the upper arm  IF YOU ARE IN A SPLINT OR CAST DO NOT REMOVE IT FOR ANY REASON   If your splint gets wet for any reason please contact the office immediately. You may shower in your splint or cast as long as you keep it dry.  This can be done by wrapping in a cast cover or garbage back (or similar)  Do Not stick any thing down your splint or cast such as pencils, money, or hangers to try and scratch yourself with.  If you feel itchy take benadryl as prescribed on the bottle for itching  IF YOU ARE IN A CAM BOOT (BLACK BOOT)  You may remove boot periodically. Perform daily dressing changes as noted below.  Wash the liner of the boot regularly and wear a sock when wearing the boot. It is recommended that you sleep in the boot until told otherwise   CALL THE OFFICE WITH ANY QUESTIONS OR CONCERNS: [phone]   VISIT OUR WEBSITE FOR ADDITIONAL INFORMATION: [email or web address]      Discharge Pin Site Instructions  Dress pins daily with Kerlix roll starting on POD 2. Wrap the Kerlix so that it tamps the skin down around the pin-skin interface to prevent/limit motion of the skin relative to the pin.  (Pin-skin motion is the primary cause of pain and infection related to external fixator pin sites).  Remove any crust or coagulum that may obstruct drainage with a saline moistened gauze or soap and water.  After POD 3, if there is no discernable drainage  on the pin site dressing, the interval for change can by increased to every other day.  You may shower with the fixator, cleaning all pin sites gently with soap and water.  If you have a surgical wound this needs to be completely dry and without drainage before showering.  The extremity can be lifted by the fixator to facilitate wound care and transfers.  Notify the office/Doctor if you experience increasing drainage, redness, or pain from a pin site, or if you notice purulent (thick, snot-like) drainage.  Discharge Wound Care Instructions  Do NOT apply any ointments, solutions or lotions to pin sites or surgical wounds.  These prevent needed drainage and even though solutions like hydrogen peroxide kill bacteria, they also damage cells lining the pin sites that help fight infection.  Applying lotions or ointments can keep the wounds moist and can cause them to breakdown and open up as well. This can increase the risk for infection.  When in doubt call the office.  Surgical incisions should be dressed daily.  If any drainage is noted, use one layer of adaptic, then gauze, Kerlix, and an ace wrap.  Once the incision is completely dry and without drainage, it may be left open to air out.  Showering may begin 36-48 hours later.  Cleaning gently with soap and water.  Traumatic wounds should be dressed daily as well.    One layer of adaptic, gauze, Kerlix, then ace wrap.  The adaptic can be discontinued once the draining has ceased    If you have a wet to dry dressing: wet the gauze with saline the squeeze as much saline out so the gauze is moist (not soaking wet), place moistened gauze over wound, then place a dry gauze over the moist one, followed by Kerlix wrap, then ace wrap.

## 2019-08-05 NOTE — Transfer of Care (Signed)
Immediate Anesthesia Transfer of Care Note  Patient: Don Perry  Procedure(s) Performed: INTRAMEDULLARY (IM) NAIL TIBIAL (Right Leg Lower) IRRIGATION AND DEBRIDEMENT EXTREMITY (Right Leg Lower) IRRIGATION AND DEBRIDEMENT KNEE (Left Knee)  Patient Location: PACU  Anesthesia Type:General  Level of Consciousness: drowsy  Airway & Oxygen Therapy: Patient Spontanous Breathing and Patient connected to nasal cannula oxygen  Post-op Assessment: Report given to RN and Post -op Vital signs reviewed and stable  Post vital signs: Reviewed and stable  Last Vitals:  Vitals Value Taken Time  BP 170/92   Temp    Pulse 112   Resp    SpO2 96     Last Pain:  Vitals:   08/05/19 0803  TempSrc:   PainSc: Asleep         Complications: No complications documented.

## 2019-08-06 DIAGNOSIS — S82201B Unspecified fracture of shaft of right tibia, initial encounter for open fracture type I or II: Secondary | ICD-10-CM | POA: Diagnosis present

## 2019-08-06 LAB — CBC
HCT: 36.6 % — ABNORMAL LOW (ref 39.0–52.0)
Hemoglobin: 12.3 g/dL — ABNORMAL LOW (ref 13.0–17.0)
MCH: 31.6 pg (ref 26.0–34.0)
MCHC: 33.6 g/dL (ref 30.0–36.0)
MCV: 94.1 fL (ref 80.0–100.0)
Platelets: 208 10*3/uL (ref 150–400)
RBC: 3.89 MIL/uL — ABNORMAL LOW (ref 4.22–5.81)
RDW: 11.9 % (ref 11.5–15.5)
WBC: 10.6 10*3/uL — ABNORMAL HIGH (ref 4.0–10.5)
nRBC: 0 % (ref 0.0–0.2)

## 2019-08-06 LAB — BASIC METABOLIC PANEL
Anion gap: 8 (ref 5–15)
BUN: 9 mg/dL (ref 6–20)
CO2: 26 mmol/L (ref 22–32)
Calcium: 8.7 mg/dL — ABNORMAL LOW (ref 8.9–10.3)
Chloride: 104 mmol/L (ref 98–111)
Creatinine, Ser: 1.07 mg/dL (ref 0.61–1.24)
GFR calc Af Amer: >60 mL/min (ref >60)
GFR calc non Af Amer: >60 mL/min (ref >60)
Glucose, Bld: 118 mg/dL — ABNORMAL HIGH (ref 70–99)
Potassium: 4.2 mmol/L (ref 3.5–5.1)
Sodium: 138 mmol/L (ref 135–145)

## 2019-08-06 LAB — GLUCOSE, CAPILLARY
Glucose-Capillary: 99 mg/dL (ref 70–99)
Glucose-Capillary: 119 mg/dL — ABNORMAL HIGH (ref 70–99)
Glucose-Capillary: 240 mg/dL — ABNORMAL HIGH (ref 70–99)

## 2019-08-06 LAB — VITAMIN D 25 HYDROXY (VIT D DEFICIENCY, FRACTURES): Vit D, 25-Hydroxy: 11.15 ng/mL — ABNORMAL LOW (ref 30–100)

## 2019-08-06 LAB — HIV ANTIBODY (ROUTINE TESTING W REFLEX): HIV Screen 4th Generation wRfx: NONREACTIVE

## 2019-08-06 MED ORDER — VITAMIN D 25 MCG (1000 UNIT) PO TABS
1000.0000 [IU] | ORAL_TABLET | Freq: Every day | ORAL | Status: DC
  Administered 2019-08-06 – 2019-08-08 (×3): 1000 [IU] via ORAL
  Filled 2019-08-06 (×3): qty 1
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
## 2019-08-06 NOTE — Plan of Care (Signed)
°  Problem: Education: °Goal: Knowledge of General Education information will improve °Description: Including pain rating scale, medication(s)/side effects and non-pharmacologic comfort measures °Outcome: Progressing °  °Problem: Health Behavior/Discharge Planning: °Goal: Ability to manage health-related needs will improve °Outcome: Progressing °  °Problem: Clinical Measurements: °Goal: Will remain free from infection °Outcome: Progressing °  °Problem: Activity: °Goal: Risk for activity intolerance will decrease °Outcome: Progressing °  °Problem: Nutrition: °Goal: Adequate nutrition will be maintained °Outcome: Progressing °  °Problem: Coping: °Goal: Level of anxiety will decrease °Outcome: Progressing °  °Problem: Elimination: °Goal: Will not experience complications related to bowel motility °Outcome: Progressing °  °Problem: Pain Managment: °Goal: General experience of comfort will improve °Outcome: Progressing °  °Problem: Safety: °Goal: Ability to remain free from injury will improve °Outcome: Progressing °  °Problem: Skin Integrity: °Goal: Risk for impaired skin integrity will decrease °Outcome: Progressing °  °
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
## 2019-08-07 LAB — CBC
HCT: 30.8 % — ABNORMAL LOW (ref 39.0–52.0)
Hemoglobin: 10.3 g/dL — ABNORMAL LOW (ref 13.0–17.0)
MCH: 31.4 pg (ref 26.0–34.0)
MCHC: 33.4 g/dL (ref 30.0–36.0)
MCV: 93.9 fL (ref 80.0–100.0)
Platelets: 170 10*3/uL (ref 150–400)
RBC: 3.28 MIL/uL — ABNORMAL LOW (ref 4.22–5.81)
RDW: 11.9 % (ref 11.5–15.5)
WBC: 7.5 10*3/uL (ref 4.0–10.5)
nRBC: 0 % (ref 0.0–0.2)

## 2019-08-07 LAB — BASIC METABOLIC PANEL
Anion gap: 8 (ref 5–15)
BUN: 9 mg/dL (ref 6–20)
CO2: 24 mmol/L (ref 22–32)
Calcium: 8.4 mg/dL — ABNORMAL LOW (ref 8.9–10.3)
Chloride: 104 mmol/L (ref 98–111)
Creatinine, Ser: 1.02 mg/dL (ref 0.61–1.24)
GFR calc Af Amer: >60 mL/min (ref >60)
GFR calc non Af Amer: >60 mL/min (ref >60)
Glucose, Bld: 100 mg/dL — ABNORMAL HIGH (ref 70–99)
Potassium: 3.6 mmol/L (ref 3.5–5.1)
Sodium: 136 mmol/L (ref 135–145)

## 2019-08-07 NOTE — Plan of Care (Signed)
  Problem: Education: Goal: Knowledge of General Education information will improve Description: Including pain rating scale, medication(s)/side effects and non-pharmacologic comfort measures Outcome: Progressing   Problem: Health Behavior/Discharge Planning: Goal: Ability to manage health-related needs will improve Outcome: Progressing   Problem: Clinical Measurements: Goal: Ability to maintain clinical measurements within normal limits will improve Outcome: Progressing Goal: Will remain free from infection Outcome: Progressing   Problem: Activity: Goal: Risk for activity intolerance will decrease Outcome: Progressing   Problem: Coping: Goal: Level of anxiety will decrease Outcome: Progressing   Problem: Elimination: Goal: Will not experience complications related to bowel motility Outcome: Progressing   Problem: Pain Managment: Goal: General experience of comfort will improve Outcome: Progressing   Problem: Safety: Goal: Ability to remain free from injury will improve Outcome: Progressing   Problem: Skin Integrity: Goal: Risk for impaired skin integrity will decrease Outcome: Progressing   
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
## 2019-08-07 NOTE — Progress Notes (Addendum)
Patient has been a little anxious because he wants to go outside and smoke a cigarette.  Nurse suggested a nicotine patch but patient refused.
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
## 2019-08-08 LAB — CBC
HCT: 30.7 % — ABNORMAL LOW (ref 39.0–52.0)
Hemoglobin: 10.3 g/dL — ABNORMAL LOW (ref 13.0–17.0)
MCH: 31.4 pg (ref 26.0–34.0)
MCHC: 33.6 g/dL (ref 30.0–36.0)
MCV: 93.6 fL (ref 80.0–100.0)
Platelets: 183 10*3/uL (ref 150–400)
RBC: 3.28 MIL/uL — ABNORMAL LOW (ref 4.22–5.81)
RDW: 11.7 % (ref 11.5–15.5)
WBC: 6.3 10*3/uL (ref 4.0–10.5)
nRBC: 0 % (ref 0.0–0.2)

## 2019-08-08 LAB — BASIC METABOLIC PANEL
Anion gap: 9 (ref 5–15)
BUN: 9 mg/dL (ref 6–20)
CO2: 26 mmol/L (ref 22–32)
Calcium: 8.7 mg/dL — ABNORMAL LOW (ref 8.9–10.3)
Chloride: 102 mmol/L (ref 98–111)
Creatinine, Ser: 0.96 mg/dL (ref 0.61–1.24)
GFR calc Af Amer: >60 mL/min (ref >60)
GFR calc non Af Amer: >60 mL/min (ref >60)
Glucose, Bld: 97 mg/dL (ref 70–99)
Potassium: 3.6 mmol/L (ref 3.5–5.1)
Sodium: 137 mmol/L (ref 135–145)

## 2019-08-08 MED ORDER — GABAPENTIN 100 MG PO CAPS: 100.0000 mg | ORAL_CAPSULE | Freq: Three times a day (TID) | ORAL | 0 refills | Status: AC

## 2019-08-08 MED ORDER — ASPIRIN EC 325 MG PO TBEC
325.0000 mg | DELAYED_RELEASE_TABLET | Freq: Two times a day (BID) | ORAL | 0 refills | Status: AC
Start: 2019-08-08 — End: 2019-09-07

## 2019-08-08 MED ORDER — VITAMIN D3 25 MCG PO TABS: 1000.0000 [IU] | ORAL_TABLET | Freq: Every day | ORAL | 1 refills | Status: DC

## 2019-08-08 MED ORDER — OXYCODONE-ACETAMINOPHEN 5-325 MG PO TABS: 1.0000 | ORAL_TABLET | ORAL | 0 refills | Status: DC | PRN

## 2019-08-08 MED ORDER — METHOCARBAMOL 500 MG PO TABS: 500.0000 mg | ORAL_TABLET | Freq: Four times a day (QID) | ORAL | 0 refills | Status: AC | PRN

## 2019-08-08 MED FILL — METHOCARBAMOL 500 MG TABS: 500 | 7 days supply | Qty: 28 | Fill #0

## 2019-08-08 MED FILL — ASPIRIN EC 325 MG TABLET: 325 | 30 days supply | Qty: 60 | Fill #0

## 2019-08-08 MED FILL — GABAPENTIN 100 MG CAPSULE: 100 | 7 days supply | Qty: 21 | Fill #0

## 2019-08-08 MED FILL — OXYCODONE-APAP 5-325MG: 5-325 | 7 days supply | Qty: 60 | Fill #0
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
## 2019-08-08 NOTE — Plan of Care (Signed)
  Problem: Education: Goal: Knowledge of General Education information will improve Description: Including pain rating scale, medication(s)/side effects and non-pharmacologic comfort measures Outcome: Progressing   Problem: Clinical Measurements: Goal: Ability to maintain clinical measurements within normal limits will improve Outcome: Progressing Goal: Will remain free from infection Outcome: Progressing   Problem: Activity: Goal: Risk for activity intolerance will decrease Outcome: Progressing   Problem: Coping: Goal: Level of anxiety will decrease Outcome: Progressing   Problem: Elimination: Goal: Will not experience complications related to bowel motility Outcome: Progressing   Problem: Pain Managment: Goal: General experience of comfort will improve Outcome: Progressing   Problem: Safety: Goal: Ability to remain free from injury will improve Outcome: Progressing   Problem: Skin Integrity: Goal: Risk for impaired skin integrity will decrease Outcome: Progressing   

## 2019-08-08 NOTE — Progress Notes (Signed)
Discharge summary packet provided to pt with instructions, and pt verbalized understanding of instructions. No complaints voiced. All questions and concerns were fully answered. D/C as ordered. Steffanie Rainwater is responsible for pt's transportation.
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
## 2019-08-08 NOTE — Plan of Care (Signed)
  Problem: Education: Goal: Knowledge of General Education information will improve Description: Including pain rating scale, medication(s)/side effects and non-pharmacologic comfort measures 08/08/2019 1158 by Gaspar Cola, RN Outcome: Progressing 08/08/2019 0758 by Gaspar Cola, RN Outcome: Progressing   Problem: Clinical Measurements: Goal: Ability to maintain clinical measurements within normal limits will improve Outcome: Progressing Goal: Will remain free from infection 08/08/2019 1158 by Gaspar Cola, RN Outcome: Progressing 08/08/2019 0758 by Gaspar Cola, RN Outcome: Progressing   Problem: Activity: Goal: Risk for activity intolerance will decrease 08/08/2019 1158 by Gaspar Cola, RN Outcome: Progressing 08/08/2019 0758 by Gaspar Cola, RN Outcome: Progressing   Problem: Coping: Goal: Level of anxiety will decrease 08/08/2019 1158 by Gaspar Cola, RN Outcome: Progressing 08/08/2019 0758 by Gaspar Cola, RN Outcome: Progressing   Problem: Elimination: Goal: Will not experience complications related to bowel motility Outcome: Progressing Goal: Will not experience complications related to urinary retention Outcome: Progressing   Problem: Pain Managment: Goal: General experience of comfort will improve 08/08/2019 1158 by Gaspar Cola, RN Outcome: Progressing 08/08/2019 0758 by Gaspar Cola, RN Outcome: Progressing   Problem: Safety: Goal: Ability to remain free from injury will improve Outcome: Progressing   Problem: Skin Integrity: Goal: Risk for impaired skin integrity will decrease Outcome: Progressing
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
## 2019-08-08 NOTE — Progress Notes (Signed)
Orthopedic Tech Progress Note Patient Details:  Don Perry April 02, 1995 [id number] RN called requesting for a PAIR OF CRUTCHES  Ortho Devices Type of Ortho Device: Crutches Ortho Device/Splint Location: Upper Right Extremity Ortho Device/Splint Interventions: Adjustment   Post Interventions Patient Tolerated: Well Instructions Provided: Care of device   [name] 08/08/2019, 2:51 PM

## 2019-08-09 ENCOUNTER — Encounter (HOSPITAL_BASED_OUTPATIENT_CLINIC_OR_DEPARTMENT_OTHER): Payer: Self-pay | Admitting: *Deleted

## 2019-08-09 ENCOUNTER — Encounter (HOSPITAL_COMMUNITY): Payer: Self-pay | Admitting: Student

## 2019-08-15 ENCOUNTER — Ambulatory Visit: Payer: Self-pay | Admitting: Physical Therapy

## 2019-08-15 ENCOUNTER — Other Ambulatory Visit: Payer: Self-pay

## 2019-08-15 ENCOUNTER — Emergency Department (HOSPITAL_BASED_OUTPATIENT_CLINIC_OR_DEPARTMENT_OTHER)
Admission: EM | Admit: 2019-08-15 | Discharge: 2019-08-15 | Disposition: A | Discharge status: Home / Self Care | Payer: Medicaid Other | Attending: Emergency Medicine | Admitting: Emergency Medicine

## 2019-08-15 ENCOUNTER — Encounter: Payer: Self-pay | Admitting: Physical Therapy

## 2019-08-15 ENCOUNTER — Encounter (HOSPITAL_BASED_OUTPATIENT_CLINIC_OR_DEPARTMENT_OTHER): Payer: Self-pay | Admitting: Emergency Medicine

## 2019-08-15 ENCOUNTER — Ambulatory Visit: Payer: Self-pay | Attending: Student | Admitting: Physical Therapy

## 2019-08-15 DIAGNOSIS — M25562 Pain in left knee: Secondary | ICD-10-CM | POA: Insufficient documentation

## 2019-08-15 DIAGNOSIS — M25662 Stiffness of left knee, not elsewhere classified: Secondary | ICD-10-CM | POA: Insufficient documentation

## 2019-08-15 DIAGNOSIS — M25671 Stiffness of right ankle, not elsewhere classified: Secondary | ICD-10-CM | POA: Insufficient documentation

## 2019-08-15 DIAGNOSIS — R262 Difficulty in walking, not elsewhere classified: Secondary | ICD-10-CM

## 2019-08-15 DIAGNOSIS — Z5321 Procedure and treatment not carried out due to patient leaving prior to being seen by health care provider: Secondary | ICD-10-CM | POA: Insufficient documentation

## 2019-08-15 DIAGNOSIS — M25561 Pain in right knee: Secondary | ICD-10-CM | POA: Insufficient documentation

## 2019-08-15 DIAGNOSIS — M79661 Pain in right lower leg: Secondary | ICD-10-CM

## 2019-08-15 DIAGNOSIS — M25661 Stiffness of right knee, not elsewhere classified: Secondary | ICD-10-CM | POA: Insufficient documentation
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
## 2019-08-15 NOTE — ED Triage Notes (Signed)
States his wound vac came off 2 days ago. He did not call his dr.

## 2019-08-18 ENCOUNTER — Other Ambulatory Visit: Payer: Self-pay

## 2019-08-18 ENCOUNTER — Ambulatory Visit: Payer: Self-pay

## 2019-08-18 DIAGNOSIS — M25561 Pain in right knee: Secondary | ICD-10-CM

## 2019-08-18 DIAGNOSIS — M25671 Stiffness of right ankle, not elsewhere classified: Secondary | ICD-10-CM

## 2019-08-18 DIAGNOSIS — M25662 Stiffness of left knee, not elsewhere classified: Secondary | ICD-10-CM

## 2019-08-18 DIAGNOSIS — R262 Difficulty in walking, not elsewhere classified: Secondary | ICD-10-CM

## 2019-08-18 DIAGNOSIS — M25661 Stiffness of right knee, not elsewhere classified: Secondary | ICD-10-CM

## 2019-08-18 DIAGNOSIS — M25562 Pain in left knee: Secondary | ICD-10-CM

## 2019-08-18 DIAGNOSIS — M79661 Pain in right lower leg: Secondary | ICD-10-CM
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
## 2019-08-25 ENCOUNTER — Other Ambulatory Visit: Payer: Self-pay

## 2019-08-25 ENCOUNTER — Ambulatory Visit: Payer: Self-pay

## 2019-08-25 DIAGNOSIS — M79661 Pain in right lower leg: Secondary | ICD-10-CM

## 2019-08-25 DIAGNOSIS — M25661 Stiffness of right knee, not elsewhere classified: Secondary | ICD-10-CM

## 2019-08-25 DIAGNOSIS — M25562 Pain in left knee: Secondary | ICD-10-CM

## 2019-08-25 DIAGNOSIS — M25561 Pain in right knee: Secondary | ICD-10-CM

## 2019-08-25 DIAGNOSIS — R262 Difficulty in walking, not elsewhere classified: Secondary | ICD-10-CM

## 2019-08-25 DIAGNOSIS — M25662 Stiffness of left knee, not elsewhere classified: Secondary | ICD-10-CM

## 2019-08-25 DIAGNOSIS — M25671 Stiffness of right ankle, not elsewhere classified: Secondary | ICD-10-CM
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
## 2019-08-26 ENCOUNTER — Ambulatory Visit: Payer: Self-pay | Admitting: Physical Therapy

## 2019-08-26 ENCOUNTER — Encounter: Payer: Self-pay | Admitting: Physical Therapy

## 2019-08-26 DIAGNOSIS — M79661 Pain in right lower leg: Secondary | ICD-10-CM

## 2019-08-26 DIAGNOSIS — M25561 Pain in right knee: Secondary | ICD-10-CM

## 2019-08-26 DIAGNOSIS — M25562 Pain in left knee: Secondary | ICD-10-CM

## 2019-08-26 DIAGNOSIS — M25662 Stiffness of left knee, not elsewhere classified: Secondary | ICD-10-CM

## 2019-08-26 DIAGNOSIS — M25671 Stiffness of right ankle, not elsewhere classified: Secondary | ICD-10-CM

## 2019-08-26 DIAGNOSIS — M25661 Stiffness of right knee, not elsewhere classified: Secondary | ICD-10-CM

## 2019-08-26 DIAGNOSIS — R262 Difficulty in walking, not elsewhere classified: Secondary | ICD-10-CM
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
## 2019-08-29 ENCOUNTER — Ambulatory Visit: Payer: Self-pay

## 2019-08-29 ENCOUNTER — Other Ambulatory Visit: Payer: Self-pay

## 2019-08-29 DIAGNOSIS — M25661 Stiffness of right knee, not elsewhere classified: Secondary | ICD-10-CM

## 2019-08-29 DIAGNOSIS — M25671 Stiffness of right ankle, not elsewhere classified: Secondary | ICD-10-CM

## 2019-08-29 DIAGNOSIS — M25561 Pain in right knee: Secondary | ICD-10-CM

## 2019-08-29 DIAGNOSIS — M79661 Pain in right lower leg: Secondary | ICD-10-CM

## 2019-08-29 DIAGNOSIS — R262 Difficulty in walking, not elsewhere classified: Secondary | ICD-10-CM

## 2019-08-29 DIAGNOSIS — M25562 Pain in left knee: Secondary | ICD-10-CM

## 2019-08-29 DIAGNOSIS — M25662 Stiffness of left knee, not elsewhere classified: Secondary | ICD-10-CM
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
## 2019-08-30 ENCOUNTER — Inpatient Hospital Stay: Payer: Medicaid Other | Admitting: Internal Medicine

## 2019-08-30 ENCOUNTER — Ambulatory Visit: Payer: Self-pay

## 2019-08-30 MED FILL — DOXYCYCLINE MONO 100 MG CAP: 100 | 7 days supply | Qty: 14 | Fill #0

## 2019-08-30 MED FILL — METHOCARBAMOL 500 MG TABS: 500 | 10 days supply | Qty: 40 | Fill #0

## 2019-08-30 MED FILL — OXYCODONE-APAP 5-325MG: 5-325 | 10 days supply | Qty: 40 | Fill #0

## 2019-09-05 ENCOUNTER — Ambulatory Visit: Payer: Self-pay | Attending: Student

## 2019-09-05 DIAGNOSIS — M79661 Pain in right lower leg: Secondary | ICD-10-CM | POA: Insufficient documentation

## 2019-09-05 DIAGNOSIS — M25661 Stiffness of right knee, not elsewhere classified: Secondary | ICD-10-CM | POA: Insufficient documentation

## 2019-09-05 DIAGNOSIS — R262 Difficulty in walking, not elsewhere classified: Secondary | ICD-10-CM | POA: Insufficient documentation

## 2019-09-05 DIAGNOSIS — M25671 Stiffness of right ankle, not elsewhere classified: Secondary | ICD-10-CM | POA: Insufficient documentation

## 2019-09-05 DIAGNOSIS — M25562 Pain in left knee: Secondary | ICD-10-CM | POA: Insufficient documentation

## 2019-09-05 DIAGNOSIS — M25561 Pain in right knee: Secondary | ICD-10-CM | POA: Insufficient documentation

## 2019-09-05 DIAGNOSIS — M25662 Stiffness of left knee, not elsewhere classified: Secondary | ICD-10-CM | POA: Insufficient documentation

## 2019-09-08 ENCOUNTER — Encounter: Payer: Self-pay | Admitting: Physical Therapy

## 2019-09-08 ENCOUNTER — Ambulatory Visit: Payer: Self-pay | Admitting: Physical Therapy

## 2019-09-08 ENCOUNTER — Other Ambulatory Visit: Payer: Self-pay

## 2019-09-08 DIAGNOSIS — R262 Difficulty in walking, not elsewhere classified: Secondary | ICD-10-CM

## 2019-09-08 DIAGNOSIS — M25561 Pain in right knee: Secondary | ICD-10-CM

## 2019-09-08 DIAGNOSIS — M79661 Pain in right lower leg: Secondary | ICD-10-CM

## 2019-09-08 DIAGNOSIS — M25671 Stiffness of right ankle, not elsewhere classified: Secondary | ICD-10-CM

## 2019-09-08 DIAGNOSIS — M25562 Pain in left knee: Secondary | ICD-10-CM

## 2019-09-08 DIAGNOSIS — M25661 Stiffness of right knee, not elsewhere classified: Secondary | ICD-10-CM

## 2019-09-08 DIAGNOSIS — M25662 Stiffness of left knee, not elsewhere classified: Secondary | ICD-10-CM
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
## 2019-09-12 ENCOUNTER — Ambulatory Visit: Payer: Self-pay | Admitting: Physical Therapy

## 2019-09-12 ENCOUNTER — Other Ambulatory Visit: Payer: Self-pay

## 2019-09-12 ENCOUNTER — Encounter: Payer: Self-pay | Admitting: Physical Therapy

## 2019-09-12 DIAGNOSIS — M25662 Stiffness of left knee, not elsewhere classified: Secondary | ICD-10-CM

## 2019-09-12 DIAGNOSIS — M25561 Pain in right knee: Secondary | ICD-10-CM

## 2019-09-12 DIAGNOSIS — M25562 Pain in left knee: Secondary | ICD-10-CM

## 2019-09-12 DIAGNOSIS — M25671 Stiffness of right ankle, not elsewhere classified: Secondary | ICD-10-CM

## 2019-09-12 DIAGNOSIS — M79661 Pain in right lower leg: Secondary | ICD-10-CM

## 2019-09-12 DIAGNOSIS — M25661 Stiffness of right knee, not elsewhere classified: Secondary | ICD-10-CM

## 2019-09-12 DIAGNOSIS — R262 Difficulty in walking, not elsewhere classified: Secondary | ICD-10-CM
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
## 2019-09-14 ENCOUNTER — Other Ambulatory Visit: Payer: Self-pay

## 2019-09-14 ENCOUNTER — Ambulatory Visit: Payer: Self-pay

## 2019-09-14 DIAGNOSIS — M25671 Stiffness of right ankle, not elsewhere classified: Secondary | ICD-10-CM

## 2019-09-14 DIAGNOSIS — R262 Difficulty in walking, not elsewhere classified: Secondary | ICD-10-CM

## 2019-09-14 DIAGNOSIS — M25561 Pain in right knee: Secondary | ICD-10-CM

## 2019-09-14 DIAGNOSIS — M25562 Pain in left knee: Secondary | ICD-10-CM

## 2019-09-14 DIAGNOSIS — M79661 Pain in right lower leg: Secondary | ICD-10-CM

## 2019-09-14 DIAGNOSIS — M25661 Stiffness of right knee, not elsewhere classified: Secondary | ICD-10-CM

## 2019-09-14 DIAGNOSIS — M25662 Stiffness of left knee, not elsewhere classified: Secondary | ICD-10-CM
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
## 2019-09-19 ENCOUNTER — Ambulatory Visit: Payer: Self-pay

## 2019-09-19 ENCOUNTER — Other Ambulatory Visit: Payer: Self-pay

## 2019-09-19 DIAGNOSIS — M25671 Stiffness of right ankle, not elsewhere classified: Secondary | ICD-10-CM

## 2019-09-19 DIAGNOSIS — M25562 Pain in left knee: Secondary | ICD-10-CM

## 2019-09-19 DIAGNOSIS — M79661 Pain in right lower leg: Secondary | ICD-10-CM

## 2019-09-19 DIAGNOSIS — M25662 Stiffness of left knee, not elsewhere classified: Secondary | ICD-10-CM

## 2019-09-19 DIAGNOSIS — M25561 Pain in right knee: Secondary | ICD-10-CM

## 2019-09-19 DIAGNOSIS — M25661 Stiffness of right knee, not elsewhere classified: Secondary | ICD-10-CM

## 2019-09-19 DIAGNOSIS — R262 Difficulty in walking, not elsewhere classified: Secondary | ICD-10-CM
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
## 2019-09-20 MED FILL — HYDROCODON-APAP 5-325: 5-325 | 10 days supply | Qty: 40 | Fill #0

## 2019-09-21 ENCOUNTER — Ambulatory Visit: Payer: Self-pay

## 2019-09-27 ENCOUNTER — Ambulatory Visit: Payer: Self-pay

## 2019-10-05 ENCOUNTER — Encounter: Payer: Medicaid Other | Admitting: Physical Therapy

## 2019-12-30 ENCOUNTER — Other Ambulatory Visit: Payer: Self-pay

## 2019-12-30 ENCOUNTER — Encounter (HOSPITAL_BASED_OUTPATIENT_CLINIC_OR_DEPARTMENT_OTHER): Payer: Self-pay | Admitting: Emergency Medicine

## 2019-12-30 ENCOUNTER — Emergency Department (HOSPITAL_BASED_OUTPATIENT_CLINIC_OR_DEPARTMENT_OTHER)
Admission: EM | Admit: 2019-12-30 | Discharge: 2019-12-31 | Disposition: A | Discharge status: Home / Self Care | Payer: Medicaid Other | Attending: Emergency Medicine | Admitting: Emergency Medicine

## 2019-12-30 DIAGNOSIS — Y288XXA Contact with other sharp object, undetermined intent, initial encounter: Secondary | ICD-10-CM | POA: Insufficient documentation

## 2019-12-30 DIAGNOSIS — S61512A Laceration without foreign body of left wrist, initial encounter: Secondary | ICD-10-CM | POA: Insufficient documentation

## 2019-12-30 DIAGNOSIS — F17210 Nicotine dependence, cigarettes, uncomplicated: Secondary | ICD-10-CM | POA: Insufficient documentation

## 2019-12-30 NOTE — ED Notes (Signed)
TDAP updated 08/05/19
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
## 2019-12-31 MED ORDER — DOXYCYCLINE HYCLATE 100 MG PO CAPS: 100.0000 mg | ORAL_CAPSULE | Freq: Two times a day (BID) | ORAL | 0 refills | Status: DC

## 2019-12-31 MED ORDER — LIDOCAINE-EPINEPHRINE (PF) 2 %-1:200000 IJ SOLN
10.0000 mL | Freq: Once | INTRAMUSCULAR | Status: DC
  Filled 2019-12-31: qty 20

## 2019-12-31 MED ORDER — DOXYCYCLINE HYCLATE 100 MG PO CAPS: 100.0000 mg | ORAL_CAPSULE | Freq: Two times a day (BID) | ORAL | 0 refills | Status: AC

## 2019-12-31 MED ORDER — DOXYCYCLINE HYCLATE 100 MG PO TABS
100.0000 mg | ORAL_TABLET | Freq: Once | ORAL | Status: AC
  Administered 2019-12-31: 01:00:00 100 mg via ORAL
  Filled 2019-12-31: qty 1
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
## 2019-12-31 NOTE — Discharge Instructions (Addendum)
Watch for signs of infection. If infection develops, the stitches may have to be removed.  Take acetaminophen or ibuprofen as needed for pain.
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
